# Patient Record
Sex: Female | Born: 1960 | Race: White | Hispanic: No | Marital: Single | Smoking: Never smoker
Health system: Southern US, Community
[De-identification: ages and names within clinical notes are randomized; demographics above are authoritative.]

## PROBLEM LIST (undated history)

## (undated) ENCOUNTER — Emergency Department (HOSPITAL_COMMUNITY): Payer: BC Managed Care – PPO

## (undated) DIAGNOSIS — J45909 Unspecified asthma, uncomplicated: Secondary | ICD-10-CM

## (undated) DIAGNOSIS — I10 Essential (primary) hypertension: Secondary | ICD-10-CM

## (undated) DIAGNOSIS — M1990 Unspecified osteoarthritis, unspecified site: Secondary | ICD-10-CM

## (undated) DIAGNOSIS — T7840XA Allergy, unspecified, initial encounter: Secondary | ICD-10-CM

## (undated) DIAGNOSIS — Z8619 Personal history of other infectious and parasitic diseases: Secondary | ICD-10-CM

## (undated) HISTORY — PX: TONSILLECTOMY AND ADENOIDECTOMY: SUR1326

## (undated) HISTORY — DX: Unspecified asthma, uncomplicated: J45.909

## (undated) HISTORY — DX: Essential (primary) hypertension: I10

## (undated) HISTORY — DX: Personal history of other infectious and parasitic diseases: Z86.19

## (undated) HISTORY — DX: Unspecified osteoarthritis, unspecified site: M19.90

## (undated) HISTORY — PX: DILATION AND CURETTAGE OF UTERUS: SHX78

## (undated) HISTORY — DX: Allergy, unspecified, initial encounter: T78.40XA

---
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
## 1998-11-22 HISTORY — PX: ABDOMINAL HYSTERECTOMY: SHX81

## 2013-07-19 ENCOUNTER — Telehealth: Payer: Self-pay | Admitting: Family Medicine

## 2013-07-19 NOTE — Telephone Encounter (Signed)
Pt has an appointment on October 21st for a new patient appointment but patients need a refill on two medications until then.  (lisinopril 40mg , symbicort 460-4.20mcg inhaler) thanks Pharmacy: [facility]

## 2013-07-20 ENCOUNTER — Other Ambulatory Visit: Payer: Self-pay | Admitting: *Deleted

## 2013-07-20 DIAGNOSIS — I10 Essential (primary) hypertension: Secondary | ICD-10-CM

## 2013-07-20 DIAGNOSIS — J441 Chronic obstructive pulmonary disease with (acute) exacerbation: Secondary | ICD-10-CM

## 2013-07-20 MED ORDER — LISINOPRIL 40 MG PO TABS: 40.0000 mg | ORAL_TABLET | Freq: Every day | ORAL | Status: DC

## 2013-07-20 MED ORDER — BUDESONIDE-FORMOTEROL FUMARATE 160-4.5 MCG/ACT IN AERO: 2.0000 | INHALATION_SPRAY | Freq: Two times a day (BID) | RESPIRATORY_TRACT | Status: DC

## 2013-07-20 NOTE — Telephone Encounter (Signed)
Rx for lisinopril and symbicort sent to [facility] on [facility].

## 2013-09-07 ENCOUNTER — Telehealth: Payer: Self-pay

## 2013-09-07 NOTE — Telephone Encounter (Signed)
Patient cancelled due to moving and working in [place].  Wanted to be close. [facility] and advised of resources available at that location.

## 2013-09-11 ENCOUNTER — Ambulatory Visit: Payer: BC Managed Care – PPO | Admitting: Family Medicine

## 2017-07-18 ENCOUNTER — Encounter: Payer: Self-pay | Admitting: Internal Medicine

## 2017-07-18 ENCOUNTER — Ambulatory Visit (INDEPENDENT_AMBULATORY_CARE_PROVIDER_SITE_OTHER): Payer: BC Managed Care – PPO | Admitting: Internal Medicine

## 2017-07-18 VITALS — BP 160/108 | HR 96 | Temp 98.1°F | Ht 65.75 in | Wt 209.0 lb

## 2017-07-18 DIAGNOSIS — M160 Bilateral primary osteoarthritis of hip: Secondary | ICD-10-CM | POA: Diagnosis not present

## 2017-07-18 DIAGNOSIS — J45909 Unspecified asthma, uncomplicated: Secondary | ICD-10-CM | POA: Insufficient documentation

## 2017-07-18 DIAGNOSIS — J4530 Mild persistent asthma, uncomplicated: Secondary | ICD-10-CM | POA: Diagnosis not present

## 2017-07-18 DIAGNOSIS — I10 Essential (primary) hypertension: Secondary | ICD-10-CM | POA: Diagnosis not present

## 2017-07-18 MED ORDER — PROAIR HFA 108 (90 BASE) MCG/ACT IN AERS: 1.0000 | INHALATION_SPRAY | RESPIRATORY_TRACT | 11 refills | Status: DC | PRN

## 2017-07-18 MED ORDER — FLUTICASONE PROPIONATE HFA 110 MCG/ACT IN AERO: 2.0000 | INHALATION_SPRAY | Freq: Two times a day (BID) | RESPIRATORY_TRACT | 12 refills | Status: DC

## 2017-07-18 NOTE — Assessment & Plan Note (Signed)
She reports she has white coat syndrome She will continue Lisinopril 40 mg daily Continue to monitor BP at home

## 2017-07-18 NOTE — Progress Notes (Signed)
HPI  Pt presents to the clinic today to establish care and for management of the conditions listed below.   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 She uses her Albuterol a few times per day. She has not seen pulmonology in many years.  HTN: Her BP today is 160/108. She is taking Lisinopril daily as prescribed, although she reports she did not take her blood pressure medication this morning. She reports her BP is always higher at the doctor's office. It runs in the 120/80's at home.   Arthritis: Mainly in her hips. She takes Advil as needed with good relief.   Flu: 08/2015 Tetanus: < 5 years ago Pap Smear: unsure, partial hysterectomy Mammogram: 2-3 years ago Colon Screening: never Vision Screening: annually Dentist: annually  Past Medical History:  Diagnosis Date  . Allergy   . Arthritis   . Asthma   . History of chicken pox   . Hypertension     Current Outpatient Prescriptions  Medication Sig Dispense Refill  . lisinopril (PRINIVIL,ZESTRIL) 40 MG tablet Take 1 tablet (40 mg total) by mouth daily. 60 tablet 0  . montelukast (SINGULAIR) 10 MG tablet     . PROAIR HFA 108 (90 Base) MCG/ACT inhaler      No current facility-administered medications for this visit.     No Known Allergies  Family History  Problem Relation Age of Onset  . Arthritis Mother   . Colon cancer Mother   . Stroke Mother   . Arthritis Father   . Asthma Father   . Diabetes Father   . Hypertension Father   . Asthma Daughter   . Stroke Maternal Grandmother   . Arthritis Maternal Grandfather   . Depression Maternal Grandfather   . Heart disease Paternal Grandfather   . Arthritis Paternal Grandfather     Social History   Social History  . Marital status: Single    Spouse name: N/A  . Number of children: N/A  . Years of education: N/A   Occupational History  . Not on file.   Social History Main Topics  . Smoking status: Never Smoker  . Smokeless tobacco: Never Used   . Alcohol use Yes     Comment: rare  . Drug use: No  . Sexual activity: Not on file   Other Topics Concern  . Not on file   Social History Narrative  . No narrative on file    ROS:  Constitutional: Denies fever, malaise, fatigue, headache or abrupt weight changes.  HEENT: Denies eye pain, eye redness, ear pain, ringing in the ears, wax buildup, runny nose, nasal congestion, bloody nose, or sore throat. Respiratory: Denies difficulty breathing, shortness of breath, cough or sputum production.   Cardiovascular: Denies chest pain, chest tightness, palpitations or swelling in the hands or feet.  Gastrointestinal: Denies abdominal pain, bloating, constipation, diarrhea or blood in the stool.  GU: Denies frequency, urgency, pain with urination, blood in urine, odor or discharge. Musculoskeletal: Pt reports intermittent joint pain. Denies decrease in range of motion, difficulty with gait, muscle pain or joint welling.  Skin: Denies redness, rashes, lesions or ulcercations.  Neurological: Denies dizziness, difficulty with memory, difficulty with speech or problems with balance and coordination.  Psych: Denies anxiety, depression, SI/HI.  No other specific complaints in a complete review of systems (except as listed in HPI above).  PE:  BP (!) 160/108   Pulse 96   Temp 98.1 F (36.7 C) (Oral)   Ht 5' 5.75" (1.67 m)  Wt 209 lb (94.8 kg)   SpO2 97%   BMI 33.99 kg/m   Wt Readings from Last 3 Encounters:  07/18/17 209 lb (94.8 kg)    General: Appears her stated age, obese in NAD. Cardiovascular: Normal rate and rhythm. S1,S2 noted.  No murmur, rubs or gallops noted.  Pulmonary/Chest: Normal effort and positive vesicular breath sounds. No respiratory distress. No wheezes, rales or ronchi noted.  Musculoskeletal:  No difficulty with gait.  Neurological: Alert and oriented.  Psychiatric: Mood and affect normal. Behavior is normal. Judgment and thought content normal.     Assessment and Plan:

## 2017-07-18 NOTE — Patient Instructions (Signed)

## 2017-07-18 NOTE — Assessment & Plan Note (Signed)
Increase Flovent to 110 mcg BID Continue Singulair daily and Albuterol prn

## 2017-07-18 NOTE — Assessment & Plan Note (Signed)
Encouraged weight loss Continue Advil prn

## 2017-07-20 ENCOUNTER — Encounter: Payer: Self-pay | Admitting: Internal Medicine

## 2017-09-08 ENCOUNTER — Encounter: Payer: BC Managed Care – PPO | Admitting: Internal Medicine

## 2017-10-10 ENCOUNTER — Encounter: Payer: Self-pay | Admitting: Internal Medicine

## 2017-10-10 ENCOUNTER — Ambulatory Visit (INDEPENDENT_AMBULATORY_CARE_PROVIDER_SITE_OTHER): Payer: BC Managed Care – PPO | Admitting: Internal Medicine

## 2017-10-10 VITALS — BP 152/98 | HR 100 | Temp 97.9°F | Ht 65.75 in | Wt 204.0 lb

## 2017-10-10 DIAGNOSIS — E559 Vitamin D deficiency, unspecified: Secondary | ICD-10-CM

## 2017-10-10 DIAGNOSIS — Z0000 Encounter for general adult medical examination without abnormal findings: Secondary | ICD-10-CM | POA: Diagnosis not present

## 2017-10-10 DIAGNOSIS — Z1231 Encounter for screening mammogram for malignant neoplasm of breast: Secondary | ICD-10-CM | POA: Diagnosis not present

## 2017-10-10 DIAGNOSIS — Z1239 Encounter for other screening for malignant neoplasm of breast: Secondary | ICD-10-CM

## 2017-10-10 DIAGNOSIS — Z131 Encounter for screening for diabetes mellitus: Secondary | ICD-10-CM

## 2017-10-10 DIAGNOSIS — Z13220 Encounter for screening for lipoid disorders: Secondary | ICD-10-CM | POA: Diagnosis not present

## 2017-10-10 DIAGNOSIS — I10 Essential (primary) hypertension: Secondary | ICD-10-CM | POA: Diagnosis not present

## 2017-10-10 DIAGNOSIS — Z23 Encounter for immunization: Secondary | ICD-10-CM

## 2017-10-10 DIAGNOSIS — Z1211 Encounter for screening for malignant neoplasm of colon: Secondary | ICD-10-CM | POA: Diagnosis not present

## 2017-10-10 LAB — CBC
HCT: 46.9 % — ABNORMAL HIGH (ref 36.0–46.0)
Hemoglobin: 15.5 g/dL — ABNORMAL HIGH (ref 12.0–15.0)
MCHC: 33.1 g/dL (ref 30.0–36.0)
MCV: 89.9 fl (ref 78.0–100.0)
Platelets: 245 10*3/uL (ref 150.0–400.0)
RBC: 5.22 Mil/uL — AB (ref 3.87–5.11)
RDW: 14.6 % (ref 11.5–15.5)
WBC: 7 10*3/uL (ref 4.0–10.5)

## 2017-10-10 LAB — COMPREHENSIVE METABOLIC PANEL
ALBUMIN: 4.3 g/dL (ref 3.5–5.2)
ALT: 14 U/L (ref 0–35)
AST: 14 U/L (ref 0–37)
Alkaline Phosphatase: 56 U/L (ref 39–117)
BUN: 17 mg/dL (ref 6–23)
CO2: 29 mEq/L (ref 19–32)
Calcium: 9.6 mg/dL (ref 8.4–10.5)
Chloride: 106 mEq/L (ref 96–112)
Creatinine, Ser: 0.7 mg/dL (ref 0.40–1.20)
GFR: 91.59 mL/min (ref >60.00)
Glucose, Bld: 89 mg/dL (ref 70–99)
POTASSIUM: 4 meq/L (ref 3.5–5.1)
SODIUM: 142 meq/L (ref 135–145)
Total Bilirubin: 0.5 mg/dL (ref 0.2–1.2)
Total Protein: 7.3 g/dL (ref 6.0–8.3)

## 2017-10-10 LAB — LIPID PANEL
CHOLESTEROL: 176 mg/dL (ref 0–200)
HDL: 67.7 mg/dL (ref >39.00)
LDL CALC: 99 mg/dL (ref 0–99)
NonHDL: 108.12
Total CHOL/HDL Ratio: 3
Triglycerides: 47 mg/dL (ref 0.0–149.0)
VLDL: 9.4 mg/dL (ref 0.0–40.0)

## 2017-10-10 LAB — VITAMIN D 25 HYDROXY (VIT D DEFICIENCY, FRACTURES): VITD: 28.78 ng/mL — ABNORMAL LOW (ref 30.00–100.00)

## 2017-10-10 LAB — HEMOGLOBIN A1C: HEMOGLOBIN A1C: 5.8 % (ref 4.6–6.5)

## 2017-10-10 NOTE — Patient Instructions (Signed)
Health Maintenance for Postmenopausal Women Menopause is a normal process in which your reproductive ability comes to an end. This process happens gradually over a span of months to years, usually between the ages of 22 and 9. Menopause is complete when you have missed 12 consecutive menstrual periods. It is important to talk with your health care provider about some of the most common conditions that affect postmenopausal women, such as heart disease, cancer, and bone loss (osteoporosis). Adopting a healthy lifestyle and getting preventive care can help to promote your health and wellness. Those actions can also lower your chances of developing some of these common conditions. What should I know about menopause? During menopause, you may experience a number of symptoms, such as:  Moderate-to-severe hot flashes.  Night sweats.  Decrease in sex drive.  Mood swings.  Headaches.  Tiredness.  Irritability.  Memory problems.  Insomnia.  Choosing to treat or not to treat menopausal changes is an individual decision that you make with your health care provider. What should I know about hormone replacement therapy and supplements? Hormone therapy products are effective for treating symptoms that are associated with menopause, such as hot flashes and night sweats. Hormone replacement carries certain risks, especially as you become older. If you are thinking about using estrogen or estrogen with progestin treatments, discuss the benefits and risks with your health care provider. What should I know about heart disease and stroke? Heart disease, heart attack, and stroke become more likely as you age. This may be due, in part, to the hormonal changes that your body experiences during menopause. These can affect how your body processes dietary fats, triglycerides, and cholesterol. Heart attack and stroke are both medical emergencies. There are many things that you can do to help prevent heart disease  and stroke:  Have your blood pressure checked at least every 1-2 years. High blood pressure causes heart disease and increases the risk of stroke.  If you are 53-22 years old, ask your health care provider if you should take aspirin to prevent a heart attack or a stroke.  Do not use any tobacco products, including cigarettes, chewing tobacco, or electronic cigarettes. If you need help quitting, ask your health care provider.  It is important to eat a healthy diet and maintain a healthy weight. ? Be sure to include plenty of vegetables, fruits, low-fat dairy products, and lean protein. ? Avoid eating foods that are high in solid fats, added sugars, or salt (sodium).  Get regular exercise. This is one of the most important things that you can do for your health. ? Try to exercise for at least 150 minutes each week. The type of exercise that you do should increase your heart rate and make you sweat. This is known as moderate-intensity exercise. ? Try to do strengthening exercises at least twice each week. Do these in addition to the moderate-intensity exercise.  Know your numbers.Ask your health care provider to check your cholesterol and your blood glucose. Continue to have your blood tested as directed by your health care provider.  What should I know about cancer screening? There are several types of cancer. Take the following steps to reduce your risk and to catch any cancer development as early as possible. Breast Cancer  Practice breast self-awareness. ? This means understanding how your breasts normally appear and feel. ? It also means doing regular breast self-exams. Let your health care provider know about any changes, no matter how small.  If you are 40  or older, have a clinician do a breast exam (clinical breast exam or CBE) every year. Depending on your age, family history, and medical history, it may be recommended that you also have a yearly breast X-ray (mammogram).  If you  have a family history of breast cancer, talk with your health care provider about genetic screening.  If you are at high risk for breast cancer, talk with your health care provider about having an MRI and a mammogram every year.  Breast cancer (BRCA) gene test is recommended for women who have family members with BRCA-related cancers. Results of the assessment will determine the need for genetic counseling and BRCA1 and for BRCA2 testing. BRCA-related cancers include these types: ? Breast. This occurs in males or females. ? Ovarian. ? Tubal. This may also be called fallopian tube cancer. ? Cancer of the abdominal or pelvic lining (peritoneal cancer). ? Prostate. ? Pancreatic.  Cervical, Uterine, and Ovarian Cancer Your health care provider may recommend that you be screened regularly for cancer of the pelvic organs. These include your ovaries, uterus, and vagina. This screening involves a pelvic exam, which includes checking for microscopic changes to the surface of your cervix (Pap test).  For women ages 21-65, health care providers may recommend a pelvic exam and a Pap test every three years. For women ages 79-65, they may recommend the Pap test and pelvic exam, combined with testing for human papilloma virus (HPV), every five years. Some types of HPV increase your risk of cervical cancer. Testing for HPV may also be done on women of any age who have unclear Pap test results.  Other health care providers may not recommend any screening for nonpregnant women who are considered low risk for pelvic cancer and have no symptoms. Ask your health care provider if a screening pelvic exam is right for you.  If you have had past treatment for cervical cancer or a condition that could lead to cancer, you need Pap tests and screening for cancer for at least 20 years after your treatment. If Pap tests have been discontinued for you, your risk factors (such as having a new sexual partner) need to be  reassessed to determine if you should start having screenings again. Some women have medical problems that increase the chance of getting cervical cancer. In these cases, your health care provider may recommend that you have screening and Pap tests more often.  If you have a family history of uterine cancer or ovarian cancer, talk with your health care provider about genetic screening.  If you have vaginal bleeding after reaching menopause, tell your health care provider.  There are currently no reliable tests available to screen for ovarian cancer.  Lung Cancer Lung cancer screening is recommended for adults 69-62 years old who are at high risk for lung cancer because of a history of smoking. A yearly low-dose CT scan of the lungs is recommended if you:  Currently smoke.  Have a history of at least 30 pack-years of smoking and you currently smoke or have quit within the past 15 years. A pack-year is smoking an average of one pack of cigarettes per day for one year.  Yearly screening should:  Continue until it has been 15 years since you quit.  Stop if you develop a health problem that would prevent you from having lung cancer treatment.  Colorectal Cancer  This type of cancer can be detected and can often be prevented.  Routine colorectal cancer screening usually begins at  age 42 and continues through age 45.  If you have risk factors for colon cancer, your health care provider may recommend that you be screened at an earlier age.  If you have a family history of colorectal cancer, talk with your health care provider about genetic screening.  Your health care provider may also recommend using home test kits to check for hidden blood in your stool.  A small camera at the end of a tube can be used to examine your colon directly (sigmoidoscopy or colonoscopy). This is done to check for the earliest forms of colorectal cancer.  Direct examination of the colon should be repeated every  5-10 years until age 71. However, if early forms of precancerous polyps or small growths are found or if you have a family history or genetic risk for colorectal cancer, you may need to be screened more often.  Skin Cancer  Check your skin from head to toe regularly.  Monitor any moles. Be sure to tell your health care provider: ? About any new moles or changes in moles, especially if there is a change in a mole's shape or color. ? If you have a mole that is larger than the size of a pencil eraser.  If any of your family members has a history of skin cancer, especially at a young age, talk with your health care provider about genetic screening.  Always use sunscreen. Apply sunscreen liberally and repeatedly throughout the day.  Whenever you are outside, protect yourself by wearing long sleeves, pants, a wide-brimmed hat, and sunglasses.  What should I know about osteoporosis? Osteoporosis is a condition in which bone destruction happens more quickly than new bone creation. After menopause, you may be at an increased risk for osteoporosis. To help prevent osteoporosis or the bone fractures that can happen because of osteoporosis, the following is recommended:  If you are 46-71 years old, get at least 1,000 mg of calcium and at least 600 mg of vitamin D per day.  If you are older than age 55 but younger than age 65, get at least 1,200 mg of calcium and at least 600 mg of vitamin D per day.  If you are older than age 54, get at least 1,200 mg of calcium and at least 800 mg of vitamin D per day.  Smoking and excessive alcohol intake increase the risk of osteoporosis. Eat foods that are rich in calcium and vitamin D, and do weight-bearing exercises several times each week as directed by your health care provider. What should I know about how menopause affects my mental health? Depression may occur at any age, but it is more common as you become older. Common symptoms of depression  include:  Low or sad mood.  Changes in sleep patterns.  Changes in appetite or eating patterns.  Feeling an overall lack of motivation or enjoyment of activities that you previously enjoyed.  Frequent crying spells.  Talk with your health care provider if you think that you are experiencing depression. What should I know about immunizations? It is important that you get and maintain your immunizations. These include:  Tetanus, diphtheria, and pertussis (Tdap) booster vaccine.  Influenza every year before the flu season begins.  Pneumonia vaccine.  Shingles vaccine.  Your health care provider may also recommend other immunizations. This information is not intended to replace advice given to you by your health care provider. Make sure you discuss any questions you have with your health care provider. Document Released: 12/31/2005  Document Revised: 05/28/2016 Document Reviewed: 08/12/2015 Elsevier Interactive Patient Education  2018 Elsevier Inc.  

## 2017-10-10 NOTE — Progress Notes (Signed)
Subjective:    Patient ID: Caitlyn Robertson, female    DOB: 1961/06/08, 56 y.o.   MRN: [id number]  HPI  Pt presents to the clinic today for her annual exam.  Flu: 08/2015 Tetanus: < 5 years Pap Smear: partial hysterectomy Mammogram: > 2 years ago Colon Screening: never Vision Screening: annually Dentist: annually  Diet: She is consuming a high protein, low carb and low sugar diet. She is trying to avoid fried foods. She drinks mostly water. Exercise: Walking 2-3 days per week  Review of Systems  Past Medical History:  Diagnosis Date  . Allergy   . Arthritis   . Asthma   . History of chicken pox   . Hypertension     Current Outpatient Medications  Medication Sig Dispense Refill  . fluticasone (FLOVENT HFA) 110 MCG/ACT inhaler Inhale 2 puffs into the lungs 2 (two) times daily. 1 Inhaler 12  . lisinopril (PRINIVIL,ZESTRIL) 40 MG tablet Take 1 tablet (40 mg total) by mouth daily. 60 tablet 0  . montelukast (SINGULAIR) 10 MG tablet     . PROAIR HFA 108 (90 Base) MCG/ACT inhaler Inhale 1-2 puffs into the lungs every 4 (four) hours as needed for wheezing or shortness of breath. 1 Inhaler 11   No current facility-administered medications for this visit.     No Known Allergies  Family History  Problem Relation Age of Onset  . Arthritis Mother   . Colon cancer Mother   . Stroke Mother   . Arthritis Father   . Asthma Father   . Diabetes Father   . Hypertension Father   . Asthma Daughter   . Stroke Maternal Grandmother   . Arthritis Maternal Grandfather   . Depression Maternal Grandfather   . Heart disease Paternal Grandfather   . Arthritis Paternal Grandfather     Social History   Socioeconomic History  . Marital status: Single    Spouse name: Not on file  . Number of children: Not on file  . Years of education: Not on file  . Highest education level: Not on file  Social Needs  . Financial resource strain: Not on file  . Food insecurity - worry: Not on  file  . Food insecurity - inability: Not on file  . Transportation needs - medical: Not on file  . Transportation needs - non-medical: Not on file  Occupational History  . Not on file  Tobacco Use  . Smoking status: Never Smoker  . Smokeless tobacco: Never Used  Substance and Sexual Activity  . Alcohol use: Yes    Comment: rare  . Drug use: No  . Sexual activity: Not on file  Other Topics Concern  . Not on file  Social History Narrative  . Not on file     Constitutional: Denies fever, malaise, fatigue, headache or abrupt weight changes.  HEENT: Denies eye pain, eye redness, ear pain, ringing in the ears, wax buildup, runny nose, nasal congestion, bloody nose, or sore throat. Respiratory: Denies difficulty breathing, shortness of breath, cough or sputum production.   Cardiovascular: Denies chest pain, chest tightness, palpitations or swelling in the hands or feet.  Gastrointestinal: Denies abdominal pain, bloating, constipation, diarrhea or blood in the stool.  GU: Denies urgency, frequency, pain with urination, burning sensation, blood in urine, odor or discharge. Musculoskeletal: Pt reports intermittent hip pain. Denies decrease in range of motion, difficulty with gait, muscle pain or joint swelling.  Skin: Denies redness, rashes, lesions or ulcercations.  Neurological: Denies dizziness,  difficulty with memory, difficulty with speech or problems with balance and coordination.  Psych: Denies anxiety, depression, SI/HI.  No other specific complaints in a complete review of systems (except as listed in HPI above).  Objective:   Physical Exam   BP (!) 152/98   Pulse 100   Temp 97.9 F (36.6 C) (Oral)   Ht 5' 5.75" (1.67 m)   Wt 204 lb (92.5 kg)   SpO2 97%   BMI 33.18 kg/m  Wt Readings from Last 3 Encounters:  10/10/17 204 lb (92.5 kg)  07/18/17 209 lb (94.8 kg)    General: Appears her stated age,obese in NAD. Skin: Warm, dry and intact.  HEENT: Head: normal shape  and size; Eyes: sclera white, no icterus, conjunctiva pink, PERRLA and EOMs intact; Ears: Tm's gray and intact, normal light reflex; Throat/Mouth: Teeth present, mucosa pink and moist, no exudate, lesions or ulcerations noted.  Neck:  Neck supple, trachea midline. No masses, lumps or thyromegaly present.  Cardiovascular: Normal rate and rhythm. S1,S2 noted.  No murmur, rubs or gallops noted. No JVD or BLE edema. No carotid bruits noted. Pulmonary/Chest: Normal effort and positive vesicular breath sounds. No respiratory distress. No wheezes, rales or ronchi noted.  Abdomen: Soft and nontender. Normal bowel sounds. No distention or masses noted. Liver, spleen and kidneys non palpable. Musculoskeletal: Strength 5/5 BUE/BLE. No difficulty with gait.  Neurological: Alert and oriented. Cranial nerves II-XII grossly intact. Coordination normal.  Psychiatric: Mood and affect normal. Behavior is normal. Judgment and thought content normal.         Assessment & Plan:   Preventative Health Maintenance:  Flu shot today Tetanus UTD per her report She declines pelvic exam and does not need pap smears Mammogram ordered, she will call [facility] to schedule, number provided Referral placed to GI for screening colonoscopy- they will call you to schedule Encouraged her to consume a balanced diet and exercise regimen Advised her to see an eye doctor and dentist annually Will check CBC< CMET, Lipid, A1C and Vit D today  RTC in 1 year, sooner if needed Webb Silversmith, NP

## 2017-10-10 NOTE — Assessment & Plan Note (Signed)
Elevated today but she reports this is much lower at home Reinforced DASH diet and exercise for weight loss CBC and CMET today Continue Lisinopril for now

## 2017-10-20 ENCOUNTER — Encounter: Payer: Self-pay | Admitting: Internal Medicine

## 2017-11-25 ENCOUNTER — Telehealth: Payer: Self-pay | Admitting: Gastroenterology

## 2017-11-25 ENCOUNTER — Other Ambulatory Visit: Payer: Self-pay

## 2017-11-25 DIAGNOSIS — Z8371 Family history of colonic polyps: Secondary | ICD-10-CM

## 2017-11-25 NOTE — Telephone Encounter (Signed)
Gastroenterology Pre-Procedure Review  Request Date:   Requesting Physician: Dr.    PATIENT REVIEW QUESTIONS: The patient responded to the following health history questions as indicated:    1. Are you having any GI issues? no 2. Do you have a personal history of Polyps? no 3. Do you have a family history of Colon Cancer or Polyps? yes (Mother -Colon cancer) 4. Diabetes Mellitus? no 5. Joint replacements in the past 12 months?no 6. Major health problems in the past 3 months?no 7. Any artificial heart valves, MVP, or defibrillator?no    MEDICATIONS & ALLERGIES:    Patient reports the following regarding taking any anticoagulation/antiplatelet therapy:   Plavix, Coumadin, Eliquis, Xarelto, Lovenox, Pradaxa, Brilinta, or Effient? no Aspirin? no  Patient confirms/reports the following medications:  Current Outpatient Medications  Medication Sig Dispense Refill   fluticasone (FLOVENT HFA) 110 MCG/ACT inhaler Inhale 2 puffs into the lungs 2 (two) times daily. 1 Inhaler 12   lisinopril (PRINIVIL,ZESTRIL) 40 MG tablet Take 1 tablet (40 mg total) by mouth daily. 60 tablet 0   montelukast (SINGULAIR) 10 MG tablet      PROAIR HFA 108 (90 Base) MCG/ACT inhaler Inhale 1-2 puffs into the lungs every 4 (four) hours as needed for wheezing or shortness of breath. 1 Inhaler 11   No current facility-administered medications for this visit.     Patient confirms/reports the following allergies:  No Known Allergies  No orders of the defined types were placed in this encounter.   AUTHORIZATION INFORMATION Primary Insurance: 1D#: Group #:  Secondary Insurance: 1D#: Group #:  SCHEDULE INFORMATION: Date:  Time: Location:

## 2018-01-03 ENCOUNTER — Telehealth: Payer: Self-pay | Admitting: Gastroenterology

## 2018-01-03 NOTE — Telephone Encounter (Signed)
Patient LVM that she has to r/s procedure with Dr. Vicente Males on 01/09/18 due to a pinched nerve.

## 2018-01-05 ENCOUNTER — Telehealth: Payer: Self-pay

## 2018-01-05 NOTE — Telephone Encounter (Signed)
Patient cancelled procedure due to a pinched nerve. Currently on crutches and unable to ambulate.  Patient to callback when healed.

## 2018-01-09 ENCOUNTER — Encounter: Admission: RE | Payer: Self-pay | Source: Ambulatory Visit

## 2018-01-09 ENCOUNTER — Ambulatory Visit
Admission: RE | Admit: 2018-01-09 | Payer: BC Managed Care – PPO | Source: Ambulatory Visit | Admitting: Gastroenterology

## 2018-01-09 SURGERY — COLONOSCOPY WITH PROPOFOL
Anesthesia: General

## 2018-01-13 ENCOUNTER — Encounter: Payer: Self-pay | Admitting: Internal Medicine

## 2018-02-10 ENCOUNTER — Encounter: Payer: Self-pay | Admitting: Internal Medicine

## 2018-02-10 NOTE — Telephone Encounter (Signed)
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

## 2018-02-21 ENCOUNTER — Ambulatory Visit (INDEPENDENT_AMBULATORY_CARE_PROVIDER_SITE_OTHER)
Admission: RE | Admit: 2018-02-21 | Discharge: 2018-02-21 | Disposition: A | Discharge status: Home / Self Care | Payer: BC Managed Care – PPO | Source: Ambulatory Visit | Attending: Internal Medicine | Admitting: Internal Medicine

## 2018-02-21 ENCOUNTER — Encounter: Payer: Self-pay | Admitting: Internal Medicine

## 2018-02-21 ENCOUNTER — Ambulatory Visit: Payer: BC Managed Care – PPO | Admitting: Internal Medicine

## 2018-02-21 ENCOUNTER — Other Ambulatory Visit: Payer: Self-pay | Admitting: Internal Medicine

## 2018-02-21 VITALS — BP 158/100 | HR 92 | Temp 97.9°F | Ht 65.75 in | Wt 213.0 lb

## 2018-02-21 DIAGNOSIS — M25551 Pain in right hip: Secondary | ICD-10-CM

## 2018-02-21 DIAGNOSIS — I10 Essential (primary) hypertension: Secondary | ICD-10-CM | POA: Diagnosis not present

## 2018-02-21 DIAGNOSIS — M25552 Pain in left hip: Secondary | ICD-10-CM

## 2018-02-21 DIAGNOSIS — M160 Bilateral primary osteoarthritis of hip: Secondary | ICD-10-CM

## 2018-02-21 LAB — SEDIMENTATION RATE: Sed Rate: 9 mm/hr (ref 0–30)

## 2018-02-21 MED ORDER — PREDNISONE 10 MG PO TABS: ORAL_TABLET | ORAL | 0 refills | Status: DC

## 2018-02-21 MED ORDER — MONTELUKAST SODIUM 10 MG PO TABS: 10.0000 mg | ORAL_TABLET | Freq: Every day | ORAL | 4 refills | Status: DC

## 2018-02-21 MED ORDER — MELOXICAM 15 MG PO TABS: 15.0000 mg | ORAL_TABLET | Freq: Every day | ORAL | 2 refills | Status: DC

## 2018-02-21 NOTE — Patient Instructions (Signed)

## 2018-02-21 NOTE — Progress Notes (Signed)
Subjective:    Patient ID: Caitlyn Robertson, female    DOB: [birth year], 57 y.o.   MRN: [id number]  HPI  Pt presents to the clinic today with c/o bilateral hip pain. This has been going for months, but worse in the last 2 months after trying to move a mattress by herself. She describes the pain as shore, achy, and tight with decreased range of motion. She reports the hip pain is affecting her balance. It is worse with weight bearing. She denies numbness or tingling in her legs. She denies any specific injury to the area. She has been taking Ibuprofen with some relief and been using a crutch to help take some of the pressure off of her hip. She denies additional major joint pains. She denies family history of autoimmune disorders but reports arthritis does run in her family.   Of note, her BP today is 158/100. She does have a history of HTN. She is taking Lisinopril as described. She reports her BP runs < 140/90 at home. She is not sure why it is so elevated at the office. She denies headaches, blurred vision, dizziness, chest pain or shortness of breath.  Review of Systems      Past Medical History:  Diagnosis Date  . Allergy   . Arthritis   . Asthma   . History of chicken pox   . Hypertension     Current Outpatient Medications  Medication Sig Dispense Refill  . fluticasone (FLOVENT HFA) 110 MCG/ACT inhaler Inhale 2 puffs into the lungs 2 (two) times daily. 1 Inhaler 12  . lisinopril (PRINIVIL,ZESTRIL) 40 MG tablet Take 1 tablet (40 mg total) by mouth daily. 60 tablet 0  . PROAIR HFA 108 (90 Base) MCG/ACT inhaler Inhale 1-2 puffs into the lungs every 4 (four) hours as needed for wheezing or shortness of breath. 1 Inhaler 11  . meloxicam (MOBIC) 15 MG tablet Take 1 tablet (15 mg total) by mouth daily. 30 tablet 2  . montelukast (SINGULAIR) 10 MG tablet      No current facility-administered medications for this visit.     No Known Allergies  Family History  Problem Relation Age  of Onset  . Arthritis Mother   . Colon cancer Mother   . Stroke Mother   . Arthritis Father   . Asthma Father   . Diabetes Father   . Hypertension Father   . Asthma Daughter   . Stroke Maternal Grandmother   . Arthritis Maternal Grandfather   . Depression Maternal Grandfather   . Heart disease Paternal Grandfather   . Arthritis Paternal Grandfather     Social History   Socioeconomic History  . Marital status: Single    Spouse name: Not on file  . Number of children: Not on file  . Years of education: Not on file  . Highest education level: Not on file  Occupational History  . Not on file  Social Needs  . Financial resource strain: Not on file  . Food insecurity:    Worry: Not on file    Inability: Not on file  . Transportation needs:    Medical: Not on file    Non-medical: Not on file  Tobacco Use  . Smoking status: Never Smoker  . Smokeless tobacco: Never Used  Substance and Sexual Activity  . Alcohol use: Yes    Comment: rare  . Drug use: No  . Sexual activity: Not on file  Lifestyle  . Physical activity:  Days per week: Not on file    Minutes per session: Not on file  . Stress: Not on file  Relationships  . Social connections:    Talks on phone: Not on file    Gets together: Not on file    Attends religious service: Not on file    Active member of club or organization: Not on file    Attends meetings of clubs or organizations: Not on file    Relationship status: Not on file  . Intimate partner violence:    Fear of current or ex partner: Not on file    Emotionally abused: Not on file    Physically abused: Not on file    Forced sexual activity: Not on file  Other Topics Concern  . Not on file  Social History Narrative  . Not on file     Constitutional: Denies fever, malaise, fatigue, headache or abrupt weight changes.  Musculoskeletal: Pt reports bilateral hip pain, decreased range of motion. Denies joint swelling.  Neurological: Pt reports  difficulty with balance. Denies dizziness, difficulty with memory, difficulty with speech or problems with coordination.    No other specific complaints in a complete review of systems (except as listed in HPI above).  Objective:   Physical Exam   BP (!) 158/100   Pulse 92   Temp 97.9 F (36.6 C) (Oral)   Ht 5' 5.75" (1.67 m)   Wt 213 lb (96.6 kg)   SpO2 98%   BMI 34.64 kg/m  Wt Readings from Last 3 Encounters:  02/21/18 213 lb (96.6 kg)  10/10/17 204 lb (92.5 kg)  07/18/17 209 lb (94.8 kg)    General: Appears her stated age, obese in NAD. Musculoskeletal: Normal flexion, extension and adduction of bilateral hips. Limited abduction of bilateral hips. No pain with palpation over lumbar spine or bilateral hips. Decreased internal and external rotation of bilateral hips. Strength 5/5 BLE. She limps with normal gait. She has trouble walking on toes and heels. Neurological: Alert and oriented. Sensation intact to BLE.   BMET    Component Value Date/Time   NA 142 10/10/2017 0847   K 4.0 10/10/2017 0847   CL 106 10/10/2017 0847   CO2 29 10/10/2017 0847   GLUCOSE 89 10/10/2017 0847   BUN 17 10/10/2017 0847   CREATININE 0.70 10/10/2017 0847   CALCIUM 9.6 10/10/2017 0847    Lipid Panel     Component Value Date/Time   CHOL 176 10/10/2017 0847   TRIG 47.0 10/10/2017 0847   HDL 67.70 10/10/2017 0847   CHOLHDL 3 10/10/2017 0847   VLDL 9.4 10/10/2017 0847   LDLCALC 99 10/10/2017 0847    CBC    Component Value Date/Time   WBC 7.0 10/10/2017 0847   RBC 5.22 (H) 10/10/2017 0847   HGB 15.5 (H) 10/10/2017 0847   HCT 46.9 (H) 10/10/2017 0847   PLT 245.0 10/10/2017 0847   MCV 89.9 10/10/2017 0847   MCHC 33.1 10/10/2017 0847   RDW 14.6 10/10/2017 0847    Hgb A1C Lab Results  Component Value Date   HGBA1C 5.8 10/10/2017           Assessment & Plan:   Bilateral Hip Pain:  Xray bilateral hips today Will check RF, ESR and ANA given her severe limited ROM eRx  for Meloxicam 15 mg daily- avoid other NSAID's Can take Tylenol as needed for pain Discussed how weight loss could improve some of her joint pain   Will follow up after  labs/xrays. Return precautions discussed Webb Silversmith, NP  May need referral to ortho, but will wait for labs/xrays results first

## 2018-02-21 NOTE — Assessment & Plan Note (Signed)
Remains elevated despite Lisinopril She thinks this is white coat syndrome Encouraged her to continue to monitor BP at home, notify me if persistently > 140/90 Reinforced DASH diet and exercise for weight loss

## 2018-02-23 LAB — ANA: ANA: NEGATIVE

## 2018-02-27 ENCOUNTER — Encounter: Payer: Self-pay | Admitting: Internal Medicine

## 2018-02-28 ENCOUNTER — Encounter: Payer: Self-pay | Admitting: Internal Medicine

## 2018-03-06 ENCOUNTER — Encounter: Payer: Self-pay | Admitting: Internal Medicine

## 2018-03-06 NOTE — Telephone Encounter (Signed)
Form place in a blue folder labeled "Forms for patient (HOLD)"

## 2018-03-14 ENCOUNTER — Ambulatory Visit: Payer: BC Managed Care – PPO | Admitting: Internal Medicine

## 2018-03-14 ENCOUNTER — Ambulatory Visit: Payer: BC Managed Care – PPO | Admitting: Podiatry

## 2018-03-14 ENCOUNTER — Encounter: Payer: Self-pay | Admitting: Podiatry

## 2018-03-14 ENCOUNTER — Encounter: Payer: Self-pay | Admitting: Internal Medicine

## 2018-03-14 VITALS — BP 178/110 | HR 99 | Temp 97.6°F | Ht 65.75 in | Wt 214.0 lb

## 2018-03-14 DIAGNOSIS — Z01818 Encounter for other preprocedural examination: Secondary | ICD-10-CM

## 2018-03-14 DIAGNOSIS — B070 Plantar wart: Secondary | ICD-10-CM

## 2018-03-14 DIAGNOSIS — M25551 Pain in right hip: Secondary | ICD-10-CM

## 2018-03-14 LAB — POCT URINALYSIS DIPSTICK
BILIRUBIN UA: NEGATIVE
Blood, UA: NEGATIVE
Glucose, UA: NEGATIVE
KETONES UA: NEGATIVE
Leukocytes, UA: NEGATIVE
Nitrite, UA: NEGATIVE
Protein, UA: NEGATIVE
Spec Grav, UA: >=1.03 — AB (ref 1.010–1.025)
Urobilinogen, UA: 0.2 E.U./dL
pH, UA: 6 (ref 5.0–8.0)

## 2018-03-14 MED ORDER — PROAIR HFA 108 (90 BASE) MCG/ACT IN AERS: 1.0000 | INHALATION_SPRAY | RESPIRATORY_TRACT | 11 refills | Status: DC | PRN

## 2018-03-14 NOTE — Progress Notes (Signed)
Subjective:    Patient ID: Caitlyn Robertson, female    DOB: 12/03/1960, 57 y.o.   MRN: [id number]  HPI  Patient presents to the clinic today for preop screening.  She is planning on having a right hip replacement in June.  She needs an EKG and lab work today.    Of note, her blood pressure is 170/110.  She is anxious when she comes to the doctor. She is taking her lisinopril daily as prescribed.  She denies chest pain or shortness of breath.   Review of Systems      Past Medical History:  Diagnosis Date  . Allergy   . Arthritis   . Asthma   . History of chicken pox   . Hypertension     Current Outpatient Medications  Medication Sig Dispense Refill  . fluticasone (FLOVENT HFA) 110 MCG/ACT inhaler Inhale 2 puffs into the lungs 2 (two) times daily. 1 Inhaler 12  . lisinopril (PRINIVIL,ZESTRIL) 40 MG tablet Take 1 tablet (40 mg total) by mouth daily. 60 tablet 0  . meloxicam (MOBIC) 15 MG tablet Take 1 tablet (15 mg total) by mouth daily. 30 tablet 2  . montelukast (SINGULAIR) 10 MG tablet Take 1 tablet (10 mg total) by mouth daily. 30 tablet 4  . PROAIR HFA 108 (90 Base) MCG/ACT inhaler Inhale 1-2 puffs into the lungs every 4 (four) hours as needed for wheezing or shortness of breath. 1 Inhaler 11   No current facility-administered medications for this visit.     No Known Allergies  Family History  Problem Relation Age of Onset  . Arthritis Mother   . Colon cancer Mother   . Stroke Mother   . Arthritis Father   . Asthma Father   . Diabetes Father   . Hypertension Father   . Asthma Daughter   . Stroke Maternal Grandmother   . Arthritis Maternal Grandfather   . Depression Maternal Grandfather   . Heart disease Paternal Grandfather   . Arthritis Paternal Grandfather     Social History   Socioeconomic History  . Marital status: Single    Spouse name: Not on file  . Number of children: Not on file  . Years of education: Not on file  . Highest education level:  Not on file  Occupational History  . Not on file  Social Needs  . Financial resource strain: Not on file  . Food insecurity:    Worry: Not on file    Inability: Not on file  . Transportation needs:    Medical: Not on file    Non-medical: Not on file  Tobacco Use  . Smoking status: Never Smoker  . Smokeless tobacco: Never Used  Substance and Sexual Activity  . Alcohol use: Yes    Comment: rare  . Drug use: No  . Sexual activity: Not on file  Lifestyle  . Physical activity:    Days per week: Not on file    Minutes per session: Not on file  . Stress: Not on file  Relationships  . Social connections:    Talks on phone: Not on file    Gets together: Not on file    Attends religious service: Not on file    Active member of club or organization: Not on file    Attends meetings of clubs or organizations: Not on file    Relationship status: Not on file  . Intimate partner violence:    Fear of current or ex partner: Not  on file    Emotionally abused: Not on file    Physically abused: Not on file    Forced sexual activity: Not on file  Other Topics Concern  . Not on file  Social History Narrative  . Not on file    Constitutional: Denies fever, malaise, fatigue, headache or abrupt weight changes.  Respiratory: Denies difficulty breathing, shortness of breath, cough or sputum production.   Cardiovascular: Denies chest pain, chest tightness, palpitations or swelling in the hands or feet.  Musculoskeletal: Patient reports right hip pain.  Denies decrease in range of motion, muscle pain or joint swelling.    No other specific complaints in a complete review of systems (except as listed in HPI above).  Objective:   Physical Exam   BP (!) 178/110 (BP Location: Right Arm, Patient Position: Sitting, Cuff Size: Large)   Pulse 99   Temp 97.6 F (36.4 C) (Oral)   Ht 5' 5.75" (1.67 m)   Wt 214 lb (97.1 kg)   SpO2 97%   BMI 34.80 kg/m   Wt Readings from Last 3 Encounters:    03/14/18 214 lb (97.1 kg)  02/21/18 213 lb (96.6 kg)  10/10/17 204 lb (92.5 kg)    General: Appears her stated age, well developed, well nourished in NAD. Cardiovascular: Normal rate and rhythm. S1,S2 noted.  No murmur, rubs or gallops noted.  Pulmonary/Chest: Normal effort and positive vesicular breath sounds. No respiratory distress. No wheezes, rales or ronchi noted.  Musculoskeletal: She is using a crutch for assistance with gait.  Neurological: Alert and oriented.    BMET    Component Value Date/Time   NA 142 10/10/2017 0847   K 4.0 10/10/2017 0847   CL 106 10/10/2017 0847   CO2 29 10/10/2017 0847   GLUCOSE 89 10/10/2017 0847   BUN 17 10/10/2017 0847   CREATININE 0.70 10/10/2017 0847   CALCIUM 9.6 10/10/2017 0847    Lipid Panel     Component Value Date/Time   CHOL 176 10/10/2017 0847   TRIG 47.0 10/10/2017 0847   HDL 67.70 10/10/2017 0847   CHOLHDL 3 10/10/2017 0847   VLDL 9.4 10/10/2017 0847   LDLCALC 99 10/10/2017 0847    CBC    Component Value Date/Time   WBC 7.0 10/10/2017 0847   RBC 5.22 (H) 10/10/2017 0847   HGB 15.5 (H) 10/10/2017 0847   HCT 46.9 (H) 10/10/2017 0847   PLT 245.0 10/10/2017 0847   MCV 89.9 10/10/2017 0847   MCHC 33.1 10/10/2017 0847   RDW 14.6 10/10/2017 0847    Hgb A1C Lab Results  Component Value Date   HGBA1C 5.8 10/10/2017           Assessment & Plan:  Preop Cardiovascular Screening:  EKG today normal Urinalysis normal Will check CBC, CMet, A1C and Albumin today Repeat blood pressure 150/90 Form will be filled out once labs are back  Will follow up after labs, return precautions discussed Webb Silversmith, NP

## 2018-03-15 LAB — COMPREHENSIVE METABOLIC PANEL
ALK PHOS: 52 U/L (ref 39–117)
ALT: 23 U/L (ref 0–35)
AST: 19 U/L (ref 0–37)
Albumin: 4.1 g/dL (ref 3.5–5.2)
BILIRUBIN TOTAL: 0.5 mg/dL (ref 0.2–1.2)
BUN: 16 mg/dL (ref 6–23)
CALCIUM: 9.5 mg/dL (ref 8.4–10.5)
CO2: 26 mEq/L (ref 19–32)
Chloride: 107 mEq/L (ref 96–112)
Creatinine, Ser: 0.82 mg/dL (ref 0.40–1.20)
GFR: 76.19 mL/min (ref >60.00)
GLUCOSE: 97 mg/dL (ref 70–99)
Potassium: 3.6 mEq/L (ref 3.5–5.1)
Sodium: 142 mEq/L (ref 135–145)
TOTAL PROTEIN: 7 g/dL (ref 6.0–8.3)

## 2018-03-15 LAB — CBC
HCT: 42.9 % (ref 36.0–46.0)
Hemoglobin: 14.6 g/dL (ref 12.0–15.0)
MCHC: 34 g/dL (ref 30.0–36.0)
MCV: 87.1 fl (ref 78.0–100.0)
PLATELETS: 233 10*3/uL (ref 150.0–400.0)
RBC: 4.93 Mil/uL (ref 3.87–5.11)
RDW: 14.9 % (ref 11.5–15.5)
WBC: 9.1 10*3/uL (ref 4.0–10.5)

## 2018-03-15 LAB — HEMOGLOBIN A1C: Hgb A1c MFr Bld: 5.8 % (ref 4.6–6.5)

## 2018-03-15 LAB — ALBUMIN: ALBUMIN: 4.1 g/dL (ref 3.5–5.2)

## 2018-03-16 NOTE — Progress Notes (Signed)
   Subjective: 57 year old female presenting today as a new patient with a chief complaint of a lesion to the medial aspect of the right third toe that appeared about 6 months ago. She also states there appears to be a nodule located on the left foot in the 2nd interspace that has been present for the past several years. She denies any pain or modifying factors. She has not had any treatment. Patient is here for further evaluation and treatment.   Past Medical History:  Diagnosis Date  . Allergy   . Arthritis   . Asthma   . History of chicken pox   . Hypertension     Objective: Physical Exam General: The patient is alert and oriented x3 in no acute distress.  Dermatology: Hyperkeratotic skin lesion noted to the third toe of the right foot approximately 1 cm in diameter. Pinpoint bleeding noted upon debridement. Skin is warm, dry and supple bilateral lower extremities. Negative for open lesions or macerations.  Vascular: Palpable pedal pulses bilaterally. No edema or erythema noted. Capillary refill within normal limits.  Neurological: Epicritic and protective threshold grossly intact bilaterally.   Musculoskeletal Exam: Pain on palpation to the note skin lesion.  Range of motion within normal limits to all pedal and ankle joints bilateral. Muscle strength 5/5 in all groups bilateral.   Assessment: #1 wart right 3rd toe  Plan of Care:  #1 Patient was evaluated. #2 Excisional debridement of the wart lesion was performed using a chisel blade. Cantharone was applied and the lesion was dressed with a dry sterile dressing. #3 patient is to return to clinic in 2 weeks.  Scheduled for hip surgery on June 23rd.   Edrick Kins, DPM [facility]  Dr. Edrick Kins, [place]                                        [place], [place] [address]                Office [phone]  Fax [phone]

## 2018-03-19 ENCOUNTER — Encounter: Payer: Self-pay | Admitting: Internal Medicine

## 2018-03-19 NOTE — Patient Instructions (Signed)
rHip Pain The hip is the joint between the upper legs and the lower pelvis. The bones, cartilage, tendons, and muscles of your hip joint support your body and allow you to move around. Hip pain can range from a minor ache to severe pain in one or both of your hips. The pain may be felt on the inside of the hip joint near the groin, or the outside near the buttocks and upper thigh. You may also have swelling or stiffness. Follow these instructions at home: Managing pain, stiffness, and swelling  If directed, apply ice to the injured area. ? Put ice in a plastic bag. ? Place a towel between your skin and the bag. ? Leave the ice on for 20 minutes, 2-3 times a day  Sleep with a pillow between your legs on your most comfortable side.  Avoid any activities that cause pain. General instructions  Take over-the-counter and prescription medicines only as told by your health care provider.  Do any exercises as told by your health care provider.  Record the following: ? How often you have hip pain. ? The location of your pain. ? What the pain feels like. ? What makes the pain worse.  Keep all follow-up visits as told by your health care provider. This is important. Contact a health care provider if:  You cannot put weight on your leg.  Your pain or swelling continues or gets worse after one week.  It gets harder to walk.  You have a fever. Get help right away if:  You fall.  You have a sudden increase in pain and swelling in your hip.  Your hip is red or swollen or very tender to touch. Summary  Hip pain can range from a minor ache to severe pain in one or both of your hips.  The pain may be felt on the inside of the hip joint near the groin, or the outside near the buttocks and upper thigh.  Avoid any activities that cause pain.  Record how often you have hip pain, the location of the pain, what makes it worse and what it feels like. This information is not intended to  replace advice given to you by your health care provider. Make sure you discuss any questions you have with your health care provider. Document Released: 04/28/2010 Document Revised: 10/11/2016 Document Reviewed: 10/11/2016 Elsevier Interactive Patient Education  [employer].

## 2018-03-28 ENCOUNTER — Ambulatory Visit: Payer: BC Managed Care – PPO | Admitting: Podiatry

## 2018-03-28 ENCOUNTER — Encounter: Payer: Self-pay | Admitting: Internal Medicine

## 2018-03-28 ENCOUNTER — Encounter: Payer: Self-pay | Admitting: Podiatry

## 2018-03-28 DIAGNOSIS — B070 Plantar wart: Secondary | ICD-10-CM | POA: Diagnosis not present

## 2018-03-29 ENCOUNTER — Other Ambulatory Visit: Payer: Self-pay | Admitting: Internal Medicine

## 2018-03-29 ENCOUNTER — Encounter: Payer: Self-pay | Admitting: Podiatry

## 2018-03-29 ENCOUNTER — Telehealth: Payer: Self-pay

## 2018-03-29 NOTE — Telephone Encounter (Signed)
I returned call to patient and informed her that I called in script early this morning.  She stated that the pharmacy had called her and told her the script was ready.   Patient has picked up the script from the pharmacy and will call with any questions or concerns

## 2018-03-30 ENCOUNTER — Telehealth: Payer: Self-pay

## 2018-03-30 MED ORDER — NONFORMULARY OR COMPOUNDED ITEM: 1 refills | Status: DC

## 2018-03-30 NOTE — Progress Notes (Signed)
   Subjective: 57 year old female presenting today for follow up evaluation of a wart noted to the right 3rd toe. She states the symptoms have improved. She denies any pain at this time. Patient is here for further evaluation and treatment.   Past Medical History:  Diagnosis Date  . Allergy   . Arthritis   . Asthma   . History of chicken pox   . Hypertension     Objective: Physical Exam General: The patient is alert and oriented x3 in no acute distress.  Dermatology: Hyperkeratotic skin lesion noted to the third toe of the right foot approximately 1 cm in diameter. Pinpoint bleeding noted upon debridement. Skin is warm, dry and supple bilateral lower extremities. Negative for open lesions or macerations.  Vascular: Palpable pedal pulses bilaterally. No edema or erythema noted. Capillary refill within normal limits.  Neurological: Epicritic and protective threshold grossly intact bilaterally.   Musculoskeletal Exam: Pain on palpation to the note skin lesion.  Range of motion within normal limits to all pedal and ankle joints bilateral. Muscle strength 5/5 in all groups bilateral.   Assessment: #1 wart right 3rd toe  Plan of Care:  #1 Patient was evaluated. #2 Excisional debridement of the wart lesion was performed using a tissue nipper. Cantharone was applied and the lesion was dressed with a dry sterile dressing. #3 Prior to debridement a digital block was given using 2 mLs of plain Lidocaine 2% without epinephrine.  #4 Prescription for wart cream to be dispensed by [facility] provided to patient.  #5 Return to clinic as needed.   Scheduled for hip surgery on June 23rd.   Edrick Kins, DPM [facility]  Dr. Edrick Kins, [place]                                        [place], [place] [address]                Office [phone]  Fax [phone]

## 2018-03-30 NOTE — Telephone Encounter (Signed)
Returned call to patient and instructed her to use the Wart cream once daily.  She verbally understood

## 2018-04-12 ENCOUNTER — Telehealth: Payer: Self-pay | Admitting: Internal Medicine

## 2018-04-12 NOTE — Telephone Encounter (Signed)
I have faxed notes EKG etc to [facility], this is the 3rd time I have faxed and I have fax log sheet to confirm

## 2018-04-12 NOTE — Telephone Encounter (Signed)
Copied from [place] [id number]. Topic: Quick Communication - See Telephone Encounter >> Apr 12, 2018 12:08 PM Ether Griffins B wrote: CRM for notification. See Telephone encounter for: 04/12/18.  Sherry with [facility] calling checking on the surgical clearance form. Pt has pre op appt on 03/14/18. They are needing the form, labs and OV notes from that day. Please fax to [phone].

## 2018-04-18 ENCOUNTER — Encounter: Payer: Self-pay | Admitting: Podiatry

## 2018-05-08 ENCOUNTER — Encounter: Payer: Self-pay | Admitting: Internal Medicine

## 2018-05-08 ENCOUNTER — Ambulatory Visit: Payer: BC Managed Care – PPO | Admitting: Internal Medicine

## 2018-05-08 DIAGNOSIS — I10 Essential (primary) hypertension: Secondary | ICD-10-CM | POA: Diagnosis not present

## 2018-05-08 MED ORDER — HYDRALAZINE HCL 10 MG PO TABS: 10.0000 mg | ORAL_TABLET | Freq: Three times a day (TID) | ORAL | 0 refills | Status: DC

## 2018-05-08 NOTE — Patient Instructions (Signed)

## 2018-05-08 NOTE — Progress Notes (Signed)
Subjective:    Patient ID: Caitlyn Robertson, female    DOB: 06/17/1961, 57 y.o.   MRN: [id number]  HPI  Pt presents to the clinic today to follow up HTN. She is scheduled for right hip replacement. She is taking her Lisinopril 40 mg daily. She was started on Hydralazine 5 mg TID per her preop evaluation. ECG from 02/2018 reviewed. Her BP today is 172/108. Her blood pressure at home range 130's/80's. She has a history of white coat syndrome and she is in pain.  Review of Systems      Past Medical History:  Diagnosis Date  . Allergy   . Arthritis   . Asthma   . History of chicken pox   . Hypertension     Current Outpatient Medications  Medication Sig Dispense Refill  . fluticasone (FLOVENT HFA) 110 MCG/ACT inhaler Inhale 2 puffs into the lungs 2 (two) times daily. 1 Inhaler 12  . lisinopril (PRINIVIL,ZESTRIL) 40 MG tablet Take 1 tablet (40 mg total) by mouth daily. 60 tablet 0  . meloxicam (MOBIC) 15 MG tablet Take 1 tablet (15 mg total) by mouth daily. 30 tablet 2  . montelukast (SINGULAIR) 10 MG tablet Take 1 tablet (10 mg total) by mouth daily. 30 tablet 4  . NONFORMULARY OR COMPOUNDED ITEM See pharmacy note 120 each 1  . PROAIR HFA 108 (90 Base) MCG/ACT inhaler Inhale 1-2 puffs into the lungs every 4 (four) hours as needed for wheezing or shortness of breath. 1 Inhaler 11  . PROAIR HFA 108 (90 Base) MCG/ACT inhaler INHALE 1-2 PUFFS INTO THE LUNGS EVERY 4 (FOUR) HOURS AS NEEDED FOR WHEEZING OR SHORTNESS OF BREATH. 8.5 Inhaler 2   No current facility-administered medications for this visit.     No Known Allergies  Family History  Problem Relation Age of Onset  . Arthritis Mother   . Colon cancer Mother   . Stroke Mother   . Arthritis Father   . Asthma Father   . Diabetes Father   . Hypertension Father   . Asthma Daughter   . Stroke Maternal Grandmother   . Arthritis Maternal Grandfather   . Depression Maternal Grandfather   . Heart disease Paternal Grandfather   .  Arthritis Paternal Grandfather     Social History   Socioeconomic History  . Marital status: Single    Spouse name: Not on file  . Number of children: Not on file  . Years of education: Not on file  . Highest education level: Not on file  Occupational History  . Not on file  Social Needs  . Financial resource strain: Not on file  . Food insecurity:    Worry: Not on file    Inability: Not on file  . Transportation needs:    Medical: Not on file    Non-medical: Not on file  Tobacco Use  . Smoking status: Never Smoker  . Smokeless tobacco: Never Used  Substance and Sexual Activity  . Alcohol use: Yes    Comment: rare  . Drug use: No  . Sexual activity: Not on file  Lifestyle  . Physical activity:    Days per week: Not on file    Minutes per session: Not on file  . Stress: Not on file  Relationships  . Social connections:    Talks on phone: Not on file    Gets together: Not on file    Attends religious service: Not on file    Active member of club  or organization: Not on file    Attends meetings of clubs or organizations: Not on file    Relationship status: Not on file  . Intimate partner violence:    Fear of current or ex partner: Not on file    Emotionally abused: Not on file    Physically abused: Not on file    Forced sexual activity: Not on file  Other Topics Concern  . Not on file  Social History Narrative  . Not on file     Constitutional: Denies fever, malaise, fatigue, headache or abrupt weight changes.  Respiratory: Denies difficulty breathing, shortness of breath, cough or sputum production.   Cardiovascular: Denies chest pain, chest tightness, palpitations or swelling in the hands or feet.  Neurological: Denies dizziness, difficulty with memory, difficulty with speech or problems with balance and coordination.  Psych: Pt reports anxiety. Denies anxiety, depression, SI/HI.  No other specific complaints in a complete review of systems (except as listed  in HPI above).  Objective:   Physical Exam   BP (!) 172/108   Pulse (!) 102   Temp 98.1 F (36.7 C) (Oral)   Wt 206 lb (93.4 kg)   SpO2 98%   BMI 33.50 kg/m  Wt Readings from Last 3 Encounters:  05/08/18 206 lb (93.4 kg)  03/14/18 214 lb (97.1 kg)  02/21/18 213 lb (96.6 kg)    General: Appears her stated age, in NAD. Cardiovascular: Normal rate and rhythm. S1,S2 noted.  No murmur, rubs or gallops noted. Pulmonary/Chest: Normal effort and positive vesicular breath sounds. No respiratory distress. No wheezes, rales or ronchi noted.  Neurological: Alert and oriented.   BMET    Component Value Date/Time   NA 142 03/14/2018 1606   K 3.6 03/14/2018 1606   CL 107 03/14/2018 1606   CO2 26 03/14/2018 1606   GLUCOSE 97 03/14/2018 1606   BUN 16 03/14/2018 1606   CREATININE 0.82 03/14/2018 1606   CALCIUM 9.5 03/14/2018 1606    Lipid Panel     Component Value Date/Time   CHOL 176 10/10/2017 0847   TRIG 47.0 10/10/2017 0847   HDL 67.70 10/10/2017 0847   CHOLHDL 3 10/10/2017 0847   VLDL 9.4 10/10/2017 0847   LDLCALC 99 10/10/2017 0847    CBC    Component Value Date/Time   WBC 9.1 03/14/2018 1606   RBC 4.93 03/14/2018 1606   HGB 14.6 03/14/2018 1606   HCT 42.9 03/14/2018 1606   PLT 233.0 03/14/2018 1606   MCV 87.1 03/14/2018 1606   MCHC 34.0 03/14/2018 1606   RDW 14.9 03/14/2018 1606    Hgb A1C Lab Results  Component Value Date   HGBA1C 5.8 03/14/2018           Assessment & Plan:

## 2018-05-08 NOTE — Assessment & Plan Note (Signed)
Elevated at doctor's office, mildly elevated at home Increase Hydralazine to 10 mg TID, continue Lisinopril Reinforced DASH diet  Will monitor blood pressure

## 2018-05-19 ENCOUNTER — Other Ambulatory Visit: Payer: Self-pay | Admitting: Internal Medicine

## 2018-05-24 ENCOUNTER — Encounter: Payer: Self-pay | Admitting: Internal Medicine

## 2018-05-24 MED ORDER — HYDRALAZINE HCL 10 MG PO TABS: 10.0000 mg | ORAL_TABLET | Freq: Three times a day (TID) | ORAL | 1 refills | Status: DC

## 2018-06-12 ENCOUNTER — Other Ambulatory Visit: Payer: Self-pay | Admitting: Internal Medicine

## 2018-07-10 ENCOUNTER — Other Ambulatory Visit: Payer: Self-pay | Admitting: Internal Medicine

## 2018-07-11 ENCOUNTER — Ambulatory Visit: Payer: BC Managed Care – PPO | Admitting: Podiatry

## 2018-07-11 ENCOUNTER — Encounter: Payer: Self-pay | Admitting: Podiatry

## 2018-07-11 ENCOUNTER — Encounter

## 2018-07-11 DIAGNOSIS — B070 Plantar wart: Secondary | ICD-10-CM

## 2018-07-13 NOTE — Progress Notes (Signed)
   Subjective: 57 year old female presenting today for follow up evaluation of a wart noted to the right 3rd toe. She states the wart returned but is not as big as it was prior. She reports continued pain when walking. Patient is here for further evaluation and treatment.   Past Medical History:  Diagnosis Date  . Allergy   . Arthritis   . Asthma   . History of chicken pox   . Hypertension     Objective: Physical Exam General: The patient is alert and oriented x3 in no acute distress.  Dermatology: Hyperkeratotic skin lesion noted to the third toe of the right foot approximately 1 cm in diameter. Pinpoint bleeding noted upon debridement. Skin is warm, dry and supple bilateral lower extremities. Negative for open lesions or macerations.  Vascular: Palpable pedal pulses bilaterally. No edema or erythema noted. Capillary refill within normal limits.  Neurological: Epicritic and protective threshold grossly intact bilaterally.   Musculoskeletal Exam: Pain on palpation to the note skin lesion.  Range of motion within normal limits to all pedal and ankle joints bilateral. Muscle strength 5/5 in all groups bilateral.   Assessment: #1 wart right 3rd toe  Plan of Care:  #1 Patient was evaluated. #2 Excisional debridement of the wart lesion was performed using a tissue nipper. Cantharone was applied and the lesion was dressed with a dry sterile dressing. #3 Return to clinic as needed.     Edrick Kins, DPM [facility]  Dr. Edrick Kins, [address]                                        [place], [place] [address]                Office [phone]  Fax [phone]

## 2018-07-17 ENCOUNTER — Encounter: Payer: Self-pay | Admitting: Internal Medicine

## 2018-07-17 DIAGNOSIS — I10 Essential (primary) hypertension: Secondary | ICD-10-CM

## 2018-07-17 MED ORDER — LISINOPRIL 40 MG PO TABS: 40.0000 mg | ORAL_TABLET | Freq: Every day | ORAL | 2 refills | Status: DC

## 2018-07-25 ENCOUNTER — Encounter: Payer: Self-pay | Admitting: Podiatry

## 2018-07-25 ENCOUNTER — Ambulatory Visit: Payer: BC Managed Care – PPO | Admitting: Podiatry

## 2018-07-29 ENCOUNTER — Other Ambulatory Visit: Payer: Self-pay | Admitting: Internal Medicine

## 2018-08-01 ENCOUNTER — Ambulatory Visit (INDEPENDENT_AMBULATORY_CARE_PROVIDER_SITE_OTHER): Payer: BC Managed Care – PPO | Admitting: Podiatry

## 2018-08-01 ENCOUNTER — Encounter: Payer: Self-pay | Admitting: Podiatry

## 2018-08-01 DIAGNOSIS — B070 Plantar wart: Secondary | ICD-10-CM

## 2018-08-03 NOTE — Progress Notes (Signed)
   HPI: 57 year old female presenting today for follow up evaluation of a plantar wart of the right third toe. She states the area has improved. She denies any pain or modifying factors. Patient is here for further evaluation and treatment.   Past Medical History:  Diagnosis Date  . Allergy   . Arthritis   . Asthma   . History of chicken pox   . Hypertension      Physical Exam: General: The patient is alert and oriented x3 in no acute distress.  Dermatology: Skin is warm, dry and supple bilateral lower extremities. Negative for open lesions or macerations.  Vascular: Palpable pedal pulses bilaterally. No edema or erythema noted. Capillary refill within normal limits.  Neurological: Epicritic and protective threshold grossly intact bilaterally.   Musculoskeletal Exam: Range of motion within normal limits to all pedal and ankle joints bilateral. Muscle strength 5/5 in all groups bilateral.   Assessment: 1. Wart right third toe   Plan of Care:  1. Patient evaluated.   2. Light debridement of area.  3. Continue using wart cream dispensed by [facility].  4. Return to clinic as needed.       Edrick Kins, DPM [facility]  Dr. Edrick Kins, DPM    [address], [place] [address]                Office [phone]  Fax [phone]

## 2018-08-20 ENCOUNTER — Other Ambulatory Visit: Payer: Self-pay | Admitting: Internal Medicine

## 2018-08-28 ENCOUNTER — Other Ambulatory Visit: Payer: Self-pay | Admitting: Internal Medicine

## 2018-09-01 NOTE — Telephone Encounter (Signed)
Well, that is good to hear. Look forward to seeing you at your next office visit.

## 2018-09-05 ENCOUNTER — Encounter: Payer: Self-pay | Admitting: Internal Medicine

## 2018-09-22 ENCOUNTER — Other Ambulatory Visit: Payer: Self-pay | Admitting: Internal Medicine

## 2018-09-25 ENCOUNTER — Other Ambulatory Visit: Payer: Self-pay | Admitting: Internal Medicine

## 2018-09-29 ENCOUNTER — Telehealth: Payer: Self-pay | Admitting: Internal Medicine

## 2018-09-29 NOTE — Telephone Encounter (Signed)
Caitlyn Robertson @ [facility] make pt surgical clearance appointment for 11/12  She will let pt know

## 2018-09-29 NOTE — Telephone Encounter (Signed)
Caitlyn Robertson with [facility] called regarding surgical clearance paperwork that  was faxed 08/17/18  Best number [phone] ext [phone]   Surgery is 11/15 with bowers left total hip replacement  Form was refaxed again today

## 2018-10-03 ENCOUNTER — Ambulatory Visit (INDEPENDENT_AMBULATORY_CARE_PROVIDER_SITE_OTHER): Payer: BC Managed Care – PPO | Admitting: Internal Medicine

## 2018-10-03 ENCOUNTER — Encounter: Payer: Self-pay | Admitting: Internal Medicine

## 2018-10-03 VITALS — BP 158/100 | HR 94 | Temp 98.1°F | Wt 214.0 lb

## 2018-10-03 DIAGNOSIS — Z01818 Encounter for other preprocedural examination: Secondary | ICD-10-CM | POA: Diagnosis not present

## 2018-10-03 DIAGNOSIS — M160 Bilateral primary osteoarthritis of hip: Secondary | ICD-10-CM | POA: Diagnosis not present

## 2018-10-03 DIAGNOSIS — I10 Essential (primary) hypertension: Secondary | ICD-10-CM | POA: Diagnosis not present

## 2018-10-03 NOTE — Patient Instructions (Signed)

## 2018-10-03 NOTE — Progress Notes (Signed)
Subjective:    Patient ID: Caitlyn Robertson, female    DOB: 10-11-1961, 57 y.o.   MRN: [id number]  HPI  Pt presents to the clinic today for preop screening and follow up HTN. She has planned left total hip arthroplasty on 10/06/18 by Dr. Harlow Mares. She had the right hip replacement on the right 6/21. She has HTN, mostly white coat and pain related. Her BP today is 160/94. She is taking Lisinopril and Hydralazine as prescribed. Her BP at home runs 118/65. ECG from 02/2018 reviewed.  Review of Systems      Past Medical History:  Diagnosis Date  . Allergy   . Arthritis   . Asthma   . History of chicken pox   . Hypertension     Current Outpatient Medications  Medication Sig Dispense Refill  . FLOVENT HFA 110 MCG/ACT inhaler INHALE 2 PUFFS INTO THE LUNGS 2 (TWO) TIMES DAILY. MUST SCHEDULE ANNUAL PHYSICAL 12 Inhaler 0  . hydrALAZINE (APRESOLINE) 10 MG tablet TAKE 1 TABLET BY MOUTH THREE TIMES A DAY 90 tablet 0  . lisinopril (PRINIVIL,ZESTRIL) 40 MG tablet Take 1 tablet (40 mg total) by mouth daily. 60 tablet 2  . montelukast (SINGULAIR) 10 MG tablet TAKE 1 TABLET BY MOUTH EVERY DAY 30 tablet 1  . NONFORMULARY OR COMPOUNDED ITEM See pharmacy note 120 each 1  . PROAIR HFA 108 (90 Base) MCG/ACT inhaler INHALE 1-2 PUFFS INTO THE LUNGS EVERY 4 (FOUR) HOURS AS NEEDED FOR WHEEZING OR SHORTNESS OF BREATH. 8.5 Inhaler 2  . meloxicam (MOBIC) 15 MG tablet TAKE 1 TABLET BY MOUTH EVERY DAY (Patient not taking: Reported on 10/03/2018) 30 tablet 1   No current facility-administered medications for this visit.     Allergies  Allergen Reactions  . Oxycodone     Family History  Problem Relation Age of Onset  . Arthritis Mother   . Colon cancer Mother   . Stroke Mother   . Arthritis Father   . Asthma Father   . Diabetes Father   . Hypertension Father   . Asthma Daughter   . Stroke Maternal Grandmother   . Arthritis Maternal Grandfather   . Depression Maternal Grandfather   . Heart disease  Paternal Grandfather   . Arthritis Paternal Grandfather     Social History   Socioeconomic History  . Marital status: Single    Spouse name: Not on file  . Number of children: Not on file  . Years of education: Not on file  . Highest education level: Not on file  Occupational History  . Not on file  Social Needs  . Financial resource strain: Not on file  . Food insecurity:    Worry: Not on file    Inability: Not on file  . Transportation needs:    Medical: Not on file    Non-medical: Not on file  Tobacco Use  . Smoking status: Never Smoker  . Smokeless tobacco: Never Used  Substance and Sexual Activity  . Alcohol use: Yes    Comment: rare  . Drug use: No  . Sexual activity: Not on file  Lifestyle  . Physical activity:    Days per week: Not on file    Minutes per session: Not on file  . Stress: Not on file  Relationships  . Social connections:    Talks on phone: Not on file    Gets together: Not on file    Attends religious service: Not on file    Active member  of club or organization: Not on file    Attends meetings of clubs or organizations: Not on file    Relationship status: Not on file  . Intimate partner violence:    Fear of current or ex partner: Not on file    Emotionally abused: Not on file    Physically abused: Not on file    Forced sexual activity: Not on file  Other Topics Concern  . Not on file  Social History Narrative  . Not on file     Constitutional: Denies fever, malaise, fatigue, headache or abrupt weight changes.  Respiratory: Denies difficulty breathing, shortness of breath, cough or sputum production.   Cardiovascular: Denies chest pain, chest tightness, palpitations or swelling in the hands or feet.  Musculoskeletal: Pt reports left hip pain. Denies decrease in range of motion, difficulty with gait, muscle pain or joint swelling.  Neurological: Denies dizziness, difficulty with memory, difficulty with speech or problems with balance and  coordination.   No other specific complaints in a complete review of systems (except as listed in HPI above).  Objective:   Physical Exam  BP (!) 160/94   Pulse 94   Temp 98.1 F (36.7 C) (Oral)   Wt 214 lb (97.1 kg)   SpO2 98%   BMI 34.80 kg/m  Wt Readings from Last 3 Encounters:  10/03/18 214 lb (97.1 kg)  05/08/18 206 lb (93.4 kg)  03/14/18 214 lb (97.1 kg)    General: Appears her stated age, obese, in NAD. Cardiovascular: Normal rate and rhythm. S1,S2 noted.  No murmur, rubs or gallops noted.  Pulmonary/Chest: Normal effort and positive vesicular breath sounds. No respiratory distress. No wheezes, rales or ronchi noted.  Musculoskeletal: Pain with internal and external rotation of the left hip. Strength 5/5 BLE. No difficulty with gait.  Neurological: Alert and oriented.   BMET    Component Value Date/Time   NA 142 03/14/2018 1606   K 3.6 03/14/2018 1606   CL 107 03/14/2018 1606   CO2 26 03/14/2018 1606   GLUCOSE 97 03/14/2018 1606   BUN 16 03/14/2018 1606   CREATININE 0.82 03/14/2018 1606   CALCIUM 9.5 03/14/2018 1606    Lipid Panel     Component Value Date/Time   CHOL 176 10/10/2017 0847   TRIG 47.0 10/10/2017 0847   HDL 67.70 10/10/2017 0847   CHOLHDL 3 10/10/2017 0847   VLDL 9.4 10/10/2017 0847   LDLCALC 99 10/10/2017 0847    CBC    Component Value Date/Time   WBC 9.1 03/14/2018 1606   RBC 4.93 03/14/2018 1606   HGB 14.6 03/14/2018 1606   HCT 42.9 03/14/2018 1606   PLT 233.0 03/14/2018 1606   MCV 87.1 03/14/2018 1606   MCHC 34.0 03/14/2018 1606   RDW 14.9 03/14/2018 1606    Hgb A1C Lab Results  Component Value Date   HGBA1C 5.8 03/14/2018            Assessment & Plan:   Preoperative Cardiovascular Screening, HTN:  ECG today Indication for ECG: preop screening, HTN Findings: Normal, no change from prior Comparison: 02/2018 Continue Lisinopril and Hydralazine for now, will readdress after surgery Labs from 02/2018 reviewed, no  need to repeat at this time  Make an appt for your annual exam Webb Silversmith, NP

## 2018-10-04 ENCOUNTER — Telehealth: Payer: Self-pay | Admitting: Internal Medicine

## 2018-10-04 NOTE — Telephone Encounter (Signed)
Wanting to know the status of clearance for pt's surgery for Friday. Please fax any notes and labs from clearance to Fax # [phone]

## 2018-10-04 NOTE — Telephone Encounter (Signed)
Has been faxed twice

## 2018-10-09 ENCOUNTER — Encounter: Payer: Self-pay | Admitting: Internal Medicine

## 2018-10-14 ENCOUNTER — Other Ambulatory Visit: Payer: Self-pay | Admitting: Internal Medicine

## 2018-10-16 ENCOUNTER — Encounter: Payer: Self-pay | Admitting: Internal Medicine

## 2018-10-16 MED ORDER — HYDRALAZINE HCL 10 MG PO TABS: 10.0000 mg | ORAL_TABLET | Freq: Three times a day (TID) | ORAL | 1 refills | Status: DC

## 2018-10-29 ENCOUNTER — Other Ambulatory Visit: Payer: Self-pay | Admitting: Internal Medicine

## 2018-11-06 ENCOUNTER — Encounter: Payer: Self-pay | Admitting: Internal Medicine

## 2018-11-27 IMAGING — DX DG HIP (WITH OR WITHOUT PELVIS) 2-3V*L*
2 series · 2 of 2 positions shown · non-contrast
Comparison: None in PACs

CLINICAL DATA: Bilateral hip pain

EXAM:
DG HIP (WITH OR WITHOUT PELVIS) 2-3V RIGHT; DG HIP (WITH OR WITHOUT
PELVIS) 2-3V LEFT

[hip ap]
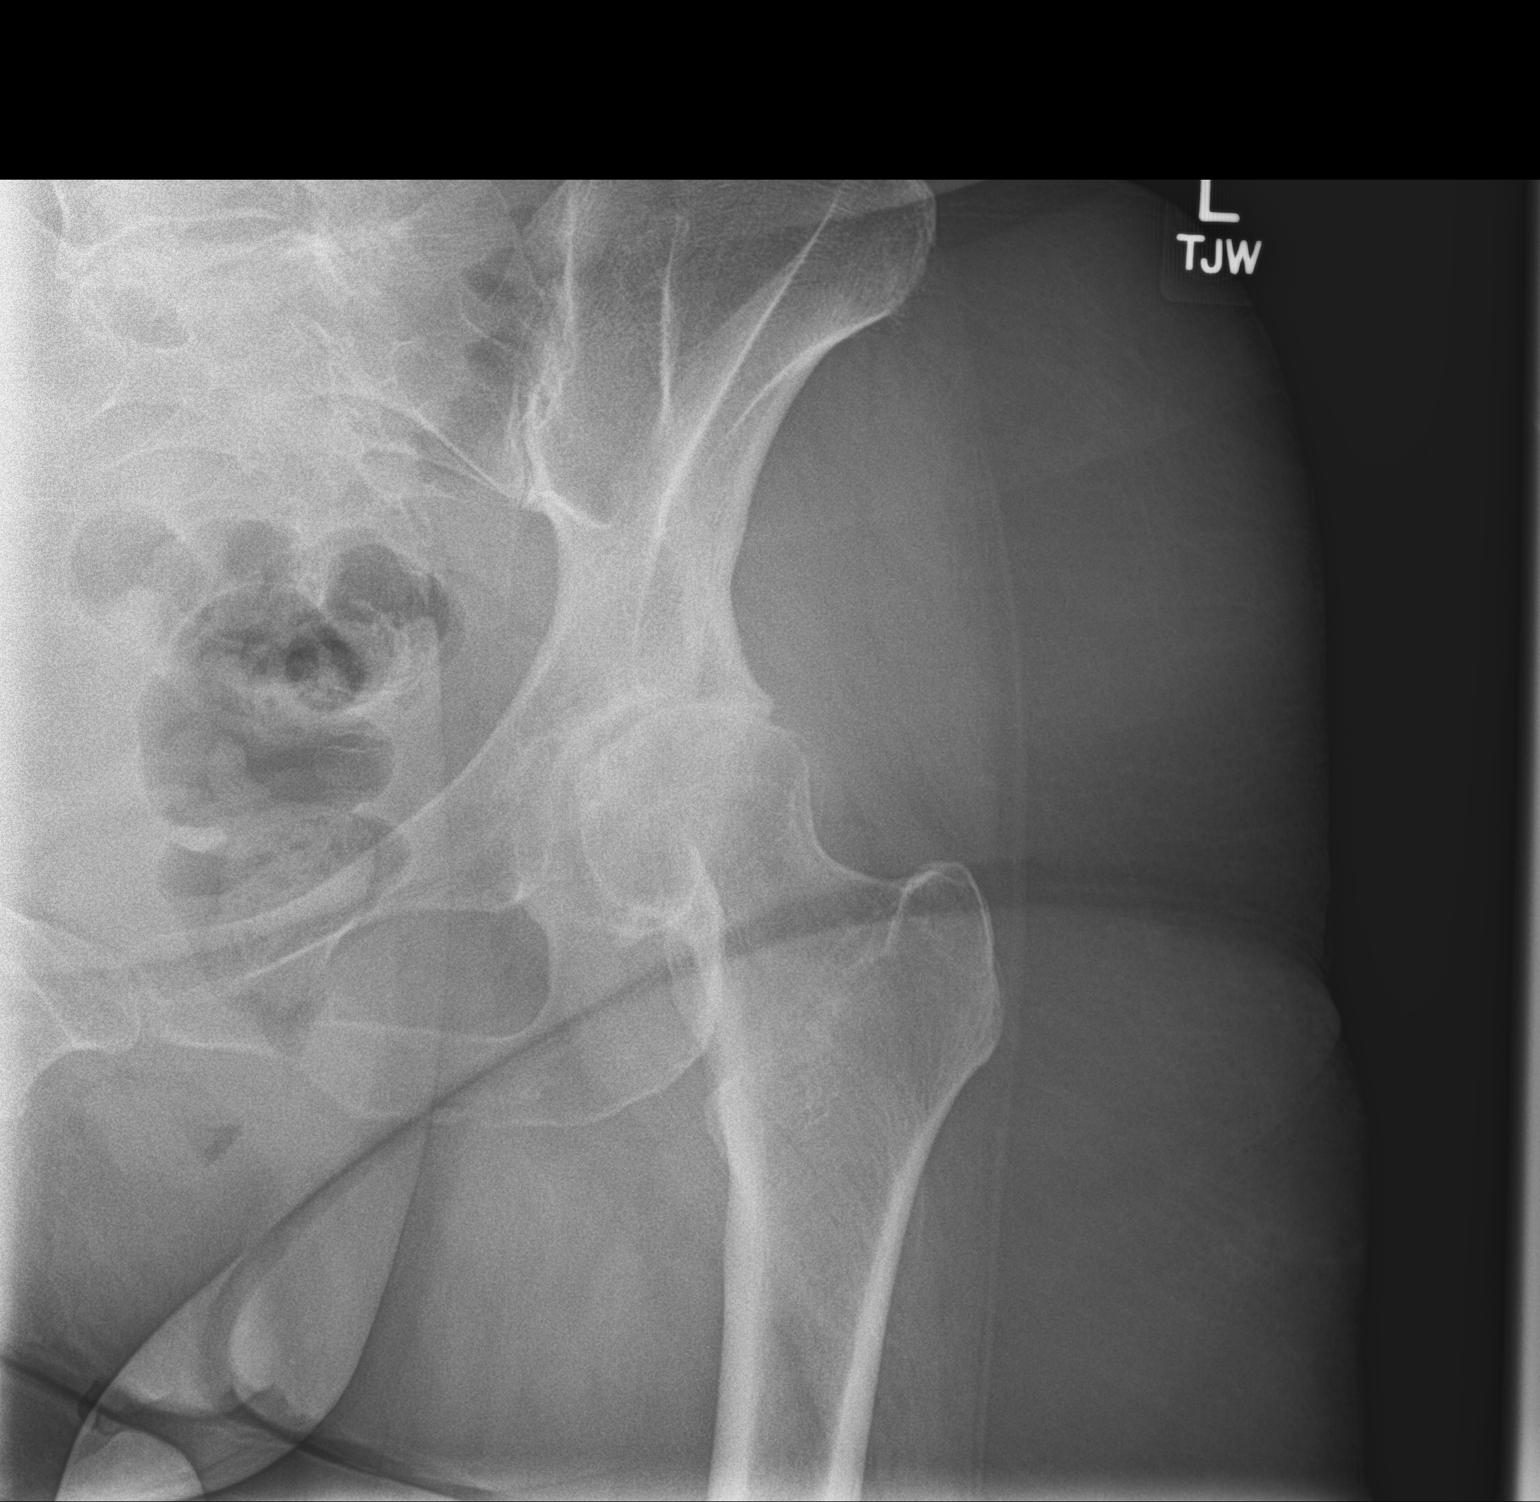

[hip lat]
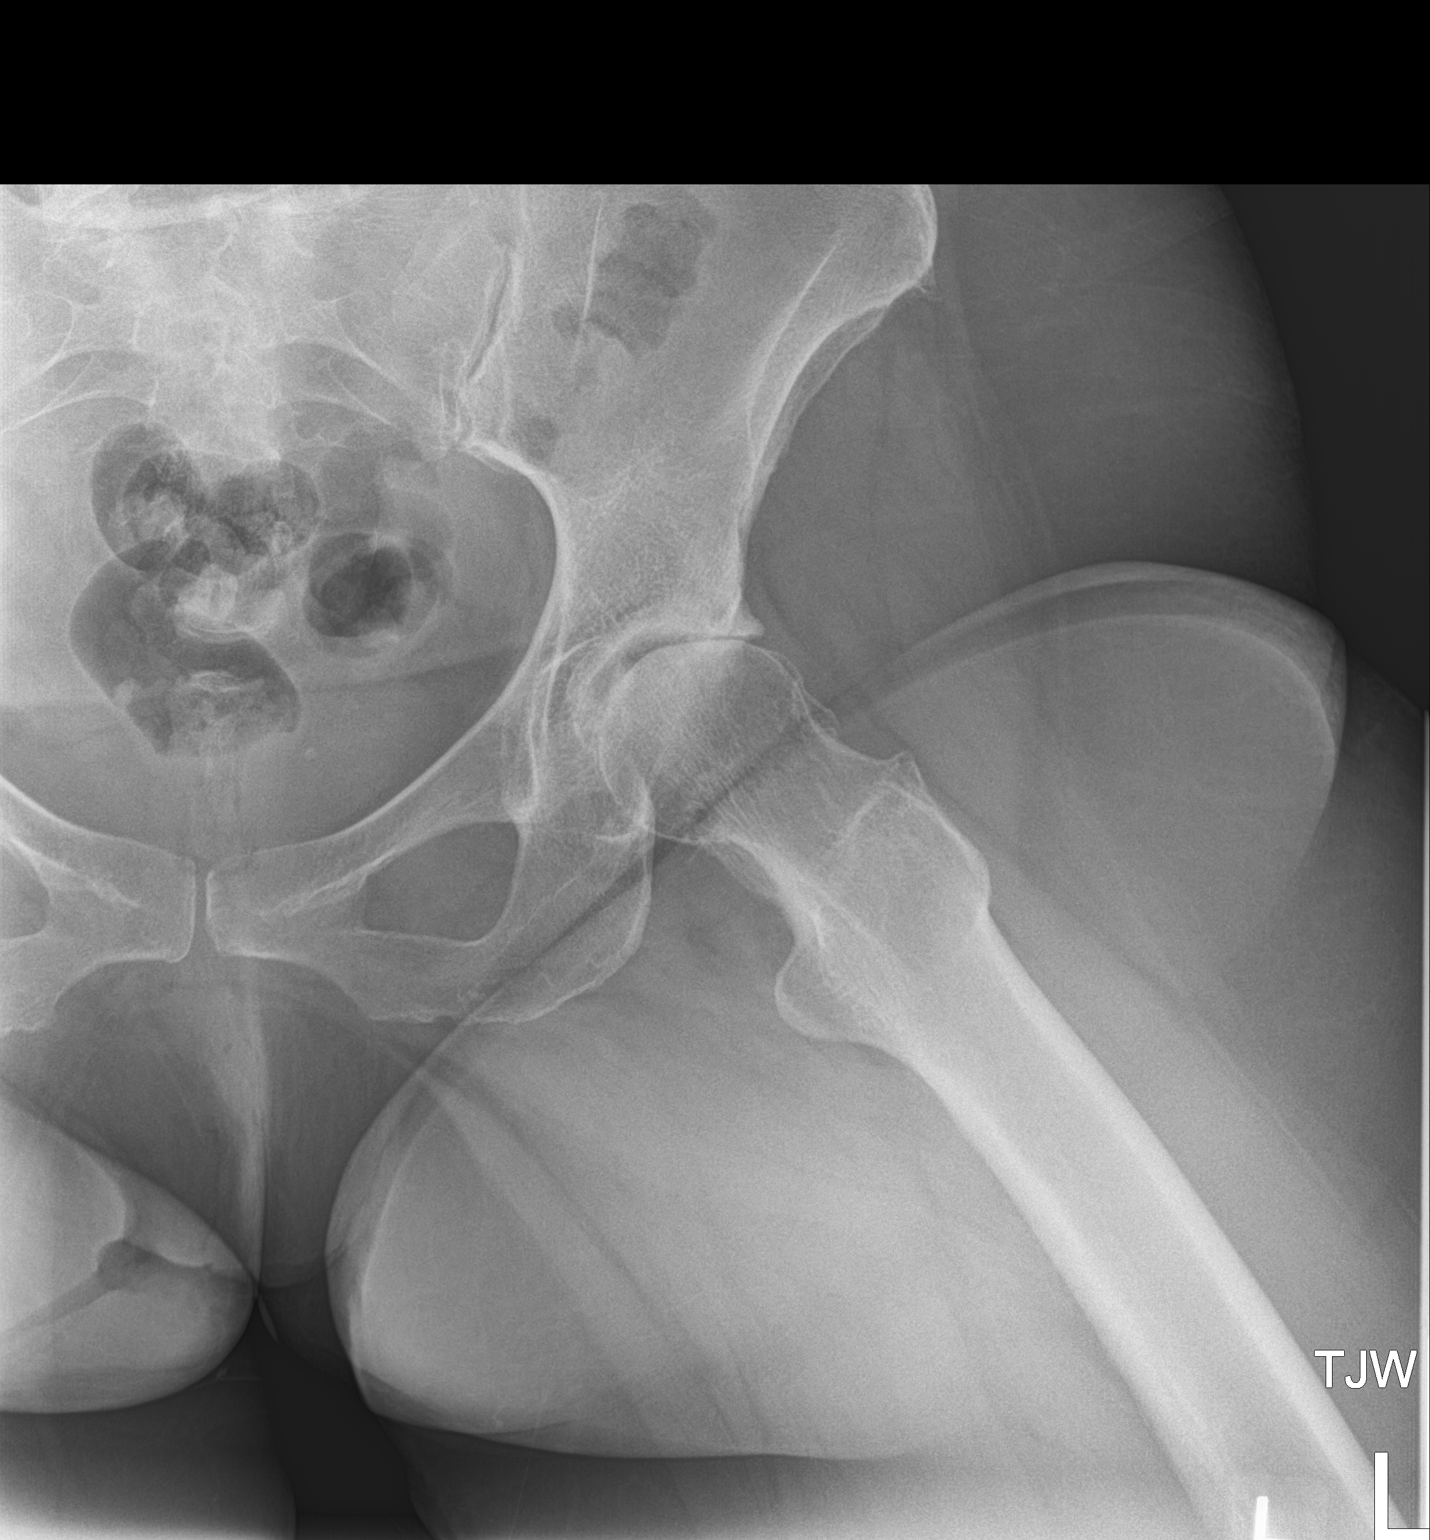

[2 of 2 positions shown; findings below may reference images not displayed]

FINDINGS: AP pelvis: The bony pelvis is subjectively adequately mineralized.
There is no lytic nor blastic lesion nor acute fracture.

Right hip: There is severe joint space loss superiorly with
flattening of the articular surface of the right femoral head. There
is a bone on bone appearance. There are subarticular cystic changes
in the femoral head. The femoral neck, intertrochanteric, and
subtrochanteric regions are normal.

Left hip: There is moderate joint space loss superiorly. The
articular surfaces of the left femoral head and acetabulum remain
smoothly rounded. The femoral neck, intertrochanteric, and
subtrochanteric regions are normal.
IMPRESSION: Severe osteoarthritic joint space loss with bone on bone appearance
of the right hip. Moderate osteoarthritic joint space loss of the
left hip. No acute bony abnormality.

## 2018-12-02 ENCOUNTER — Other Ambulatory Visit: Payer: Self-pay | Admitting: Internal Medicine

## 2018-12-05 ENCOUNTER — Other Ambulatory Visit: Payer: Self-pay | Admitting: Internal Medicine

## 2018-12-27 ENCOUNTER — Other Ambulatory Visit: Payer: Self-pay | Admitting: Internal Medicine

## 2019-01-05 ENCOUNTER — Other Ambulatory Visit: Payer: Self-pay | Admitting: Internal Medicine

## 2019-01-05 DIAGNOSIS — I10 Essential (primary) hypertension: Secondary | ICD-10-CM

## 2019-02-03 ENCOUNTER — Other Ambulatory Visit: Payer: Self-pay | Admitting: Internal Medicine

## 2019-02-06 ENCOUNTER — Encounter: Payer: Self-pay | Admitting: Internal Medicine

## 2019-02-06 DIAGNOSIS — I10 Essential (primary) hypertension: Secondary | ICD-10-CM

## 2019-02-09 MED ORDER — LISINOPRIL 40 MG PO TABS: 40.0000 mg | ORAL_TABLET | Freq: Every day | ORAL | 1 refills | Status: DC

## 2019-02-12 MED ORDER — ALBUTEROL SULFATE HFA 108 (90 BASE) MCG/ACT IN AERS: 1.0000 | INHALATION_SPRAY | Freq: Four times a day (QID) | RESPIRATORY_TRACT | 3 refills | Status: DC | PRN

## 2019-02-12 NOTE — Addendum Note (Signed)
Addended by: Jearld Fenton on: 02/12/2019 01:46 PM   Modules accepted: Orders

## 2019-02-19 ENCOUNTER — Encounter: Payer: Self-pay | Admitting: Internal Medicine

## 2019-04-06 ENCOUNTER — Other Ambulatory Visit: Payer: Self-pay | Admitting: Internal Medicine

## 2019-04-06 DIAGNOSIS — I10 Essential (primary) hypertension: Secondary | ICD-10-CM

## 2019-04-12 ENCOUNTER — Other Ambulatory Visit: Payer: Self-pay | Admitting: Internal Medicine

## 2019-04-25 ENCOUNTER — Encounter: Payer: Self-pay | Admitting: Internal Medicine

## 2019-04-25 MED ORDER — FLUTICASONE PROPIONATE HFA 110 MCG/ACT IN AERO: INHALATION_SPRAY | RESPIRATORY_TRACT | 1 refills | Status: DC

## 2019-05-05 ENCOUNTER — Other Ambulatory Visit: Payer: Self-pay | Admitting: Internal Medicine

## 2019-05-05 DIAGNOSIS — I10 Essential (primary) hypertension: Secondary | ICD-10-CM

## 2019-06-05 ENCOUNTER — Encounter: Payer: Self-pay | Admitting: Internal Medicine

## 2019-06-12 ENCOUNTER — Other Ambulatory Visit: Payer: Self-pay | Admitting: Internal Medicine

## 2019-06-12 DIAGNOSIS — I10 Essential (primary) hypertension: Secondary | ICD-10-CM

## 2019-06-28 ENCOUNTER — Other Ambulatory Visit: Payer: Self-pay | Admitting: Internal Medicine

## 2019-07-17 ENCOUNTER — Other Ambulatory Visit: Payer: Self-pay | Admitting: Internal Medicine

## 2019-07-17 DIAGNOSIS — I10 Essential (primary) hypertension: Secondary | ICD-10-CM

## 2019-07-29 ENCOUNTER — Other Ambulatory Visit: Payer: Self-pay | Admitting: Internal Medicine

## 2019-08-02 ENCOUNTER — Encounter: Payer: Self-pay | Admitting: Internal Medicine

## 2019-08-02 ENCOUNTER — Other Ambulatory Visit: Payer: Self-pay | Admitting: Internal Medicine

## 2019-08-02 MED ORDER — PROAIR HFA 108 (90 BASE) MCG/ACT IN AERS: INHALATION_SPRAY | RESPIRATORY_TRACT | 1 refills | Status: DC

## 2019-08-16 ENCOUNTER — Other Ambulatory Visit: Payer: Self-pay | Admitting: Internal Medicine

## 2019-08-16 DIAGNOSIS — I10 Essential (primary) hypertension: Secondary | ICD-10-CM

## 2019-09-01 ENCOUNTER — Other Ambulatory Visit: Payer: Self-pay | Admitting: Internal Medicine

## 2019-09-02 ENCOUNTER — Other Ambulatory Visit: Payer: Self-pay | Admitting: Internal Medicine

## 2019-09-27 ENCOUNTER — Other Ambulatory Visit: Payer: Self-pay | Admitting: Internal Medicine

## 2019-10-22 ENCOUNTER — Encounter: Payer: Self-pay | Admitting: Internal Medicine

## 2019-10-22 DIAGNOSIS — I10 Essential (primary) hypertension: Secondary | ICD-10-CM

## 2019-10-25 MED ORDER — MONTELUKAST SODIUM 10 MG PO TABS: ORAL_TABLET | ORAL | 1 refills | Status: DC

## 2019-10-25 MED ORDER — LISINOPRIL 40 MG PO TABS: 40.0000 mg | ORAL_TABLET | Freq: Every day | ORAL | 1 refills | Status: DC

## 2019-10-26 ENCOUNTER — Other Ambulatory Visit: Payer: Self-pay | Admitting: Internal Medicine

## 2019-11-09 ENCOUNTER — Other Ambulatory Visit: Payer: Self-pay | Admitting: Internal Medicine

## 2019-11-23 ENCOUNTER — Other Ambulatory Visit: Payer: Self-pay | Admitting: Internal Medicine

## 2019-12-08 ENCOUNTER — Other Ambulatory Visit: Payer: Self-pay | Admitting: Internal Medicine

## 2019-12-08 DIAGNOSIS — I10 Essential (primary) hypertension: Secondary | ICD-10-CM

## 2019-12-24 ENCOUNTER — Encounter: Payer: Self-pay | Admitting: Internal Medicine

## 2019-12-24 MED ORDER — ALBUTEROL SULFATE HFA 108 (90 BASE) MCG/ACT IN AERS: 1.0000 | INHALATION_SPRAY | RESPIRATORY_TRACT | 0 refills | Status: DC | PRN

## 2019-12-27 ENCOUNTER — Other Ambulatory Visit: Payer: Self-pay | Admitting: Internal Medicine

## 2020-01-09 ENCOUNTER — Other Ambulatory Visit: Payer: Self-pay | Admitting: Internal Medicine

## 2020-01-10 ENCOUNTER — Other Ambulatory Visit: Payer: Self-pay | Admitting: Internal Medicine

## 2020-01-10 DIAGNOSIS — I10 Essential (primary) hypertension: Secondary | ICD-10-CM

## 2020-01-21 ENCOUNTER — Other Ambulatory Visit: Payer: Self-pay | Admitting: Internal Medicine

## 2020-01-24 ENCOUNTER — Encounter: Payer: Self-pay | Admitting: Internal Medicine

## 2020-01-24 DIAGNOSIS — I10 Essential (primary) hypertension: Secondary | ICD-10-CM

## 2020-01-24 MED ORDER — FLOVENT HFA 110 MCG/ACT IN AERO: 2.0000 | INHALATION_SPRAY | Freq: Two times a day (BID) | RESPIRATORY_TRACT | 0 refills | Status: DC

## 2020-01-24 MED ORDER — ALBUTEROL SULFATE HFA 108 (90 BASE) MCG/ACT IN AERS: 1.0000 | INHALATION_SPRAY | RESPIRATORY_TRACT | 0 refills | Status: DC | PRN

## 2020-01-24 MED ORDER — LISINOPRIL 40 MG PO TABS: 40.0000 mg | ORAL_TABLET | Freq: Every day | ORAL | 0 refills | Status: DC

## 2020-01-24 MED ORDER — MONTELUKAST SODIUM 10 MG PO TABS: 10.0000 mg | ORAL_TABLET | Freq: Every day | ORAL | 0 refills | Status: DC

## 2020-02-24 ENCOUNTER — Other Ambulatory Visit: Payer: Self-pay | Admitting: Internal Medicine

## 2020-04-20 ENCOUNTER — Other Ambulatory Visit: Payer: Self-pay | Admitting: Internal Medicine

## 2020-04-23 ENCOUNTER — Other Ambulatory Visit: Payer: Self-pay | Admitting: Internal Medicine

## 2020-04-23 ENCOUNTER — Encounter: Payer: Self-pay | Admitting: Internal Medicine

## 2020-04-23 DIAGNOSIS — I10 Essential (primary) hypertension: Secondary | ICD-10-CM

## 2020-04-24 MED ORDER — LISINOPRIL 40 MG PO TABS: 40.0000 mg | ORAL_TABLET | Freq: Every day | ORAL | 0 refills | Status: DC

## 2020-05-08 ENCOUNTER — Encounter: Payer: Self-pay | Admitting: Internal Medicine

## 2020-05-08 ENCOUNTER — Other Ambulatory Visit: Payer: Self-pay

## 2020-05-08 ENCOUNTER — Ambulatory Visit (INDEPENDENT_AMBULATORY_CARE_PROVIDER_SITE_OTHER): Payer: BC Managed Care – PPO | Admitting: Internal Medicine

## 2020-05-08 VITALS — BP 148/96 | HR 76 | Temp 97.1°F | Ht 65.5 in | Wt 237.0 lb

## 2020-05-08 DIAGNOSIS — Z1231 Encounter for screening mammogram for malignant neoplasm of breast: Secondary | ICD-10-CM

## 2020-05-08 DIAGNOSIS — Z780 Asymptomatic menopausal state: Secondary | ICD-10-CM

## 2020-05-08 DIAGNOSIS — I10 Essential (primary) hypertension: Secondary | ICD-10-CM | POA: Diagnosis not present

## 2020-05-08 DIAGNOSIS — M160 Bilateral primary osteoarthritis of hip: Secondary | ICD-10-CM | POA: Diagnosis not present

## 2020-05-08 DIAGNOSIS — Z1211 Encounter for screening for malignant neoplasm of colon: Secondary | ICD-10-CM

## 2020-05-08 DIAGNOSIS — J4530 Mild persistent asthma, uncomplicated: Secondary | ICD-10-CM | POA: Diagnosis not present

## 2020-05-08 DIAGNOSIS — Z0000 Encounter for general adult medical examination without abnormal findings: Secondary | ICD-10-CM | POA: Diagnosis not present

## 2020-05-08 LAB — CBC
HCT: 43.8 % (ref 36.0–46.0)
Hemoglobin: 14.5 g/dL (ref 12.0–15.0)
MCHC: 33 g/dL (ref 30.0–36.0)
MCV: 87.1 fl (ref 78.0–100.0)
Platelets: 218 10*3/uL (ref 150.0–400.0)
RBC: 5.04 Mil/uL (ref 3.87–5.11)
RDW: 15.5 % (ref 11.5–15.5)
WBC: 7 10*3/uL (ref 4.0–10.5)

## 2020-05-08 LAB — COMPREHENSIVE METABOLIC PANEL
ALT: 18 U/L (ref 0–35)
AST: 14 U/L (ref 0–37)
Albumin: 4.3 g/dL (ref 3.5–5.2)
Alkaline Phosphatase: 56 U/L (ref 39–117)
BUN: 12 mg/dL (ref 6–23)
CO2: 28 mEq/L (ref 19–32)
Calcium: 9.2 mg/dL (ref 8.4–10.5)
Chloride: 106 mEq/L (ref 96–112)
Creatinine, Ser: 0.75 mg/dL (ref 0.40–1.20)
GFR: 78.87 mL/min (ref >60.00)
Glucose, Bld: 97 mg/dL (ref 70–99)
Potassium: 3.7 mEq/L (ref 3.5–5.1)
Sodium: 141 mEq/L (ref 135–145)
Total Bilirubin: 0.4 mg/dL (ref 0.2–1.2)
Total Protein: 7.1 g/dL (ref 6.0–8.3)

## 2020-05-08 LAB — LIPID PANEL
Cholesterol: 156 mg/dL (ref 0–200)
HDL: 61.7 mg/dL (ref >39.00)
LDL Cholesterol: 85 mg/dL (ref 0–99)
NonHDL: 94.5
Total CHOL/HDL Ratio: 3
Triglycerides: 46 mg/dL (ref 0.0–149.0)
VLDL: 9.2 mg/dL (ref 0.0–40.0)

## 2020-05-08 LAB — VITAMIN D 25 HYDROXY (VIT D DEFICIENCY, FRACTURES): VITD: 36.28 ng/mL (ref 30.00–100.00)

## 2020-05-08 LAB — HEMOGLOBIN A1C: Hgb A1c MFr Bld: 5.9 % (ref 4.6–6.5)

## 2020-05-08 MED ORDER — MONTELUKAST SODIUM 10 MG PO TABS: ORAL_TABLET | ORAL | 3 refills | Status: DC

## 2020-05-08 MED ORDER — LISINOPRIL 40 MG PO TABS: 40.0000 mg | ORAL_TABLET | Freq: Every day | ORAL | 3 refills | Status: DC

## 2020-05-08 MED ORDER — FLOVENT HFA 110 MCG/ACT IN AERO: 2.0000 | INHALATION_SPRAY | Freq: Two times a day (BID) | RESPIRATORY_TRACT | 6 refills | Status: DC

## 2020-05-08 MED ORDER — ALBUTEROL SULFATE HFA 108 (90 BASE) MCG/ACT IN AERS: 1.0000 | INHALATION_SPRAY | RESPIRATORY_TRACT | 2 refills | Status: DC | PRN

## 2020-05-08 NOTE — Assessment & Plan Note (Signed)
Elevated today, but normal at home per her readings Lisinopril refilled today Reinforced DASH diet and exercise for weight loss CMET today

## 2020-05-08 NOTE — Patient Instructions (Signed)

## 2020-05-08 NOTE — Assessment & Plan Note (Signed)
Refilled Flovent, Albuterol and Singulair Will monitor

## 2020-05-08 NOTE — Assessment & Plan Note (Signed)
Pain resolved s/p THR bilaterally She will continue to follow with ortho

## 2020-05-08 NOTE — Progress Notes (Signed)
Subjective:    Patient ID: Caitlyn Robertson, female    DOB: Jul 08, 1961, 59 y.o.   MRN: [id number]  HPI  Pt presents to the clinic today for her annual exam. She is also due to follow up chronic conditions.  White [place]at HTN: Her BP today is 148/96.  At home, her BP runs 128/70's. She is taking Lisinopril as prescribed.  ECG from 09/2018 reviewed.  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  Improved after hip replacement. She is no longer taking Meloxicam. She continues to follow with ortho.  Allergy Induced Asthma: Worse in the.  Managed on Singulair, Flovent and Albuterol.  There are no PFTs on file.  She does not follow with pulmonology.  Flu: 09/2018 Tetanus: 2019 Shingrix: never Covid: never Pap Smear: partial hysterectomy Mammogram: > 2 years ago Bone Density: never Colon Screening: never Vision Screening: annually Dentist: annually  Diet: She does eat meat. She consumes more veggies than fruits. She occasionally eats fried foods. She drinks mostly coffee, water, Dt. Coke. Exercise: None  Review of Systems  Past Medical History:  Diagnosis Date  . Allergy   . Arthritis   . Asthma   . History of chicken pox   . Hypertension     Current Outpatient Medications  Medication Sig Dispense Refill  . albuterol (VENTOLIN HFA) 108 (90 Base) MCG/ACT inhaler INHALE 1-2 PUFFS INTO THE LUNGS EVERY 4 (FOUR) HOURS AS NEEDED FOR WHEEZING OR SHORTNESS OF BREATH. 18 g 0  . FLOVENT HFA 110 MCG/ACT inhaler INHALE 2 PUFFS INTO THE LUNGS IN THE MORNING AND AT BEDTIME. 36 Inhaler 0  . hydrALAZINE (APRESOLINE) 10 MG tablet TAKE 1 TABLET (10 MG TOTAL) BY MOUTH 3 (THREE) TIMES DAILY. MUST SCHEDULE ANNUAL EXAM 90 tablet 0  . lisinopril (ZESTRIL) 40 MG tablet Take 1 tablet (40 mg total) by mouth daily. 90 tablet 0  . meloxicam (MOBIC) 15 MG tablet TAKE 1 TABLET BY MOUTH EVERY DAY (Patient not taking: Reported on 10/03/2018) 30 tablet 1  . montelukast (SINGULAIR) 10 MG tablet TAKE 1 TABLET BY MOUTH EVERYDAY AT  BEDTIME 90 tablet 0  . NONFORMULARY OR COMPOUNDED ITEM See pharmacy note 120 each 1   No current facility-administered medications for this visit.    Allergies  Allergen Reactions  . Oxycodone     Family History  Problem Relation Age of Onset  . Arthritis Mother   . Colon cancer Mother   . Stroke Mother   . Arthritis Father   . Asthma Father   . Diabetes Father   . Hypertension Father   . Asthma Daughter   . Stroke Maternal Grandmother   . Arthritis Maternal Grandfather   . Depression Maternal Grandfather   . Heart disease Paternal Grandfather   . Arthritis Paternal Grandfather     Social History   Socioeconomic History  . Marital status: Single    Spouse name: Not on file  . Number of children: Not on file  . Years of education: Not on file  . Highest education level: Not on file  Occupational History  . Not on file  Tobacco Use  . Smoking status: Never Smoker  . Smokeless tobacco: Never Used  Substance and Sexual Activity  . Alcohol use: Yes    Comment: rare  . Drug use: No  . Sexual activity: Not on file  Other Topics Concern  . Not on file  Social History Narrative  . Not on file   Social Determinants of Health   Financial  Resource Strain:   . Difficulty of Paying Living Expenses:   Food Insecurity:   . Worried About Charity fundraiser in the Last Year:   . Arboriculturist in the Last Year:   Transportation Needs:   . Film/video editor (Medical):   Marland Kitchen Lack of Transportation (Non-Medical):   Physical Activity:   . Days of Exercise per Week:   . Minutes of Exercise per Session:   Stress:   . Feeling of Stress :   Social Connections:   . Frequency of Communication with Friends and Family:   . Frequency of Social Gatherings with Friends and Family:   . Attends Religious Services:   . Active Member of Clubs or Organizations:   . Attends Archivist Meetings:   Marland Kitchen Marital Status:   Intimate Partner Violence:   . Fear of Current or  Ex-Partner:   . Emotionally Abused:   Marland Kitchen Physically Abused:   . Sexually Abused:      Constitutional: Pt reports weight gain. Denies fever, malaise, fatigue, headache.  HEENT: Denies eye pain, eye redness, ear pain, ringing in the ears, wax buildup, runny nose, nasal congestion, bloody nose, or sore throat. Respiratory: Denies difficulty breathing, shortness of breath, cough or sputum production.   Cardiovascular: Denies chest pain, chest tightness, palpitations or swelling in the hands or feet.  Gastrointestinal: Denies abdominal pain, bloating, constipation, diarrhea or blood in the stool.  GU: Denies urgency, frequency, pain with urination, burning sensation, blood in urine, odor or discharge. Musculoskeletal: Denies decrease in range of motion, difficulty with gait, muscle pain or joint pain or swelling.  Skin: Denies redness, rashes, lesions or ulcercations.  Neurological: Denies dizziness, difficulty with memory, difficulty with speech or problems with balance and coordination.  Psych: Denies anxiety, depression, SI/HI.  No other specific complaints in a complete review of systems (except as listed in HPI above).     Objective:   Physical Exam  BP (!) 148/96   Pulse 76   Temp (!) 97.1 F (36.2 C) (Temporal)   Ht 5' 5.5" (1.664 m)   Wt 237 lb (107.5 kg)   SpO2 98%   BMI 38.84 kg/m   Wt Readings from Last 3 Encounters:  10/03/18 214 lb (97.1 kg)  05/08/18 206 lb (93.4 kg)  03/14/18 214 lb (97.1 kg)    General: Appears her stated age, obese, in NAD. Skin: Warm, dry and intact. No rashes, lesions or ulcerations noted.  HEENT: Head: normal shape and size; Eyes: sclera white, no icterus, conjunctiva pink, PERRLA and EOMs intact;  Neck:  Neck supple, trachea midline. No masses, lumps or thyromegaly present.  Cardiovascular: Normal rate and rhythm. S1,S2 noted.  No murmur, rubs or gallops noted. No JVD or BLE edema. No carotid bruits noted. Pulmonary/Chest: Normal effort and  positive vesicular breath sounds. No respiratory distress. No wheezes, rales or ronchi noted.  Abdomen: Soft and nontender. Normal bowel sounds. No distention or masses noted. Liver, spleen and kidneys non palpable. Musculoskeletal: Strength 5/5 BUE/BLE. No difficulty with gait.  Neurological: Alert and oriented. Cranial nerves II-XII grossly intact. Coordination normal.  Psychiatric: Mood and affect normal. Behavior is normal. Judgment and thought content normal.    BMET    Component Value Date/Time   NA 142 03/14/2018 1606   K 3.6 03/14/2018 1606   CL 107 03/14/2018 1606   CO2 26 03/14/2018 1606   GLUCOSE 97 03/14/2018 1606   BUN 16 03/14/2018 1606   CREATININE 0.82  03/14/2018 1606   CALCIUM 9.5 03/14/2018 1606    Lipid Panel     Component Value Date/Time   CHOL 176 10/10/2017 0847   TRIG 47.0 10/10/2017 0847   HDL 67.70 10/10/2017 0847   CHOLHDL 3 10/10/2017 0847   VLDL 9.4 10/10/2017 0847   LDLCALC 99 10/10/2017 0847    CBC    Component Value Date/Time   WBC 9.1 03/14/2018 1606   RBC 4.93 03/14/2018 1606   HGB 14.6 03/14/2018 1606   HCT 42.9 03/14/2018 1606   PLT 233.0 03/14/2018 1606   MCV 87.1 03/14/2018 1606   MCHC 34.0 03/14/2018 1606   RDW 14.9 03/14/2018 1606    Hgb A1C Lab Results  Component Value Date   HGBA1C 5.8 03/14/2018            Assessment & Plan:   Preventative Health Maintenance:  Encouraged her to get a flu shot in the fall Tetanus UTD She will consider Covid vaccine She will consider Shingrix vaccine She no longer needs pap smears Mammogram ordered, she will call to schedule Bone density ordered, she will call to schedule Referral to GI for screening colonoscopy Encouraged him to consume a balanced diet and exercise regimen Advised her to see an eye doctor and dentist annually Will check CBC, CMET, Lipid, A1C and Vit D today  RTC in 1 year, sooner if needed Webb Silversmith, NP This visit occurred during the SARS-CoV-2  public health emergency.  Safety protocols were in place, including screening questions prior to the visit, additional usage of staff PPE, and extensive cleaning of exam room while observing appropriate contact time as indicated for disinfecting solutions.

## 2020-05-09 ENCOUNTER — Encounter: Payer: Self-pay | Admitting: Internal Medicine

## 2020-05-22 ENCOUNTER — Other Ambulatory Visit: Payer: Self-pay

## 2020-05-22 ENCOUNTER — Telehealth (INDEPENDENT_AMBULATORY_CARE_PROVIDER_SITE_OTHER): Payer: Self-pay | Admitting: Gastroenterology

## 2020-05-22 DIAGNOSIS — Z1211 Encounter for screening for malignant neoplasm of colon: Secondary | ICD-10-CM

## 2020-05-22 NOTE — Progress Notes (Signed)
Gastroenterology Pre-Procedure Review  Request Date: Friday 08/22/20 Requesting Physician: Dr. Vicente Males  PATIENT REVIEW QUESTIONS: The patient responded to the following health history questions as indicated:    1. Are you having any GI issues? no 2. Do you have a personal history of Polyps? no 3. Do you have a family history of Colon Cancer or Polyps? yes (mother colon cancer) 4. Diabetes Mellitus? no 5. Joint replacements in the past 12 months?no 6. Major health problems in the past 3 months?no 7. Any artificial heart valves, MVP, or defibrillator?no    MEDICATIONS & ALLERGIES:    Patient reports the following regarding taking any anticoagulation/antiplatelet therapy:   Plavix, Coumadin, Eliquis, Xarelto, Lovenox, Pradaxa, Brilinta, or Effient? no Aspirin? no  Patient confirms/reports the following medications:  Current Outpatient Medications  Medication Sig Dispense Refill  . acetaminophen (TYLENOL) 500 MG tablet Take 500 mg by mouth as needed.    Marland Kitchen albuterol (VENTOLIN HFA) 108 (90 Base) MCG/ACT inhaler Inhale 1-2 puffs into the lungs every 4 (four) hours as needed for wheezing or shortness of breath. 18 g 2  . fluticasone (FLOVENT HFA) 110 MCG/ACT inhaler Inhale 2 puffs into the lungs in the morning and at bedtime. 1 Inhaler 6  . lisinopril (ZESTRIL) 40 MG tablet Take 1 tablet (40 mg total) by mouth daily. 90 tablet 3  . montelukast (SINGULAIR) 10 MG tablet TAKE 1 TABLET BY MOUTH EVERYDAY AT BEDTIME 90 tablet 3   No current facility-administered medications for this visit.    Patient confirms/reports the following allergies:  Allergies  Allergen Reactions  . Oxycodone     No orders of the defined types were placed in this encounter.   AUTHORIZATION INFORMATION Primary Insurance: 1D#: Group #:  Secondary Insurance: 1D#: Group #:  SCHEDULE INFORMATION: Date: Friday 08/22/20 Time: Location:[facility]

## 2020-07-11 ENCOUNTER — Encounter: Payer: Self-pay | Admitting: Internal Medicine

## 2020-07-28 ENCOUNTER — Encounter: Payer: Self-pay | Admitting: Internal Medicine

## 2020-07-29 MED ORDER — ALBUTEROL SULFATE HFA 108 (90 BASE) MCG/ACT IN AERS: 1.0000 | INHALATION_SPRAY | RESPIRATORY_TRACT | 2 refills | Status: DC | PRN

## 2020-08-18 ENCOUNTER — Telehealth: Payer: Self-pay | Admitting: Gastroenterology

## 2020-08-18 NOTE — Telephone Encounter (Signed)
Patient needs to cancel her procedure for this Friday 10.1.21 with Dr. Vicente Males due to her boss having covid and her having to quarantine. Patient states she will cb to resch at a later time. Please cancel procedure for now.

## 2020-08-22 ENCOUNTER — Ambulatory Visit
Admission: RE | Admit: 2020-08-22 | Payer: BC Managed Care – PPO | Source: Ambulatory Visit | Admitting: Gastroenterology

## 2020-08-22 ENCOUNTER — Encounter: Admission: RE | Payer: Self-pay | Source: Ambulatory Visit

## 2020-08-22 SURGERY — COLONOSCOPY WITH PROPOFOL
Anesthesia: General

## 2021-01-14 ENCOUNTER — Other Ambulatory Visit: Payer: Self-pay | Admitting: Internal Medicine

## 2021-02-25 ENCOUNTER — Encounter: Payer: Self-pay | Admitting: Internal Medicine

## 2021-02-25 MED ORDER — ALBUTEROL SULFATE HFA 108 (90 BASE) MCG/ACT IN AERS: 1.0000 | INHALATION_SPRAY | RESPIRATORY_TRACT | 0 refills | Status: DC | PRN

## 2021-07-03 ENCOUNTER — Other Ambulatory Visit: Payer: Self-pay

## 2021-07-03 ENCOUNTER — Encounter: Payer: Self-pay | Admitting: Internal Medicine

## 2021-07-03 DIAGNOSIS — I10 Essential (primary) hypertension: Secondary | ICD-10-CM

## 2021-07-03 DIAGNOSIS — J4530 Mild persistent asthma, uncomplicated: Secondary | ICD-10-CM

## 2021-07-03 MED ORDER — LISINOPRIL 40 MG PO TABS: 40.0000 mg | ORAL_TABLET | Freq: Every day | ORAL | 3 refills | Status: DC

## 2021-07-03 MED ORDER — FLUTICASONE PROPIONATE HFA 110 MCG/ACT IN AERO: 2.0000 | INHALATION_SPRAY | Freq: Two times a day (BID) | RESPIRATORY_TRACT | 1 refills | Status: DC

## 2021-07-03 MED ORDER — MONTELUKAST SODIUM 10 MG PO TABS: ORAL_TABLET | ORAL | 3 refills | Status: DC

## 2021-07-03 MED ORDER — ALBUTEROL SULFATE HFA 108 (90 BASE) MCG/ACT IN AERS: 1.0000 | INHALATION_SPRAY | RESPIRATORY_TRACT | 0 refills | Status: DC | PRN

## 2021-08-31 ENCOUNTER — Encounter: Payer: Self-pay | Admitting: Internal Medicine

## 2021-08-31 DIAGNOSIS — J4530 Mild persistent asthma, uncomplicated: Secondary | ICD-10-CM

## 2021-09-01 MED ORDER — ALBUTEROL SULFATE HFA 108 (90 BASE) MCG/ACT IN AERS: 1.0000 | INHALATION_SPRAY | RESPIRATORY_TRACT | 2 refills | Status: DC | PRN

## 2021-11-24 ENCOUNTER — Encounter: Payer: Self-pay | Admitting: Internal Medicine

## 2022-01-24 ENCOUNTER — Other Ambulatory Visit: Payer: Self-pay | Admitting: Internal Medicine

## 2022-01-24 DIAGNOSIS — J4530 Mild persistent asthma, uncomplicated: Secondary | ICD-10-CM

## 2022-01-25 ENCOUNTER — Encounter: Payer: Self-pay | Admitting: Internal Medicine

## 2022-01-25 MED ORDER — FLUTICASONE-SALMETEROL 100-50 MCG/ACT IN AEPB: 1.0000 | INHALATION_SPRAY | Freq: Two times a day (BID) | RESPIRATORY_TRACT | 0 refills | Status: DC

## 2022-01-26 NOTE — Telephone Encounter (Signed)
Requested medication (s) are due for refill today:   No  Discontinued 01/25/2022 ? ?Requested medication (s) are on the active medication list:   No  Flovent was ordered instead per MyChart message ? ?Future visit scheduled:   No ? ? ?Last ordered: Returned because not on med list.   Also a non delegated refill    ? ?Requested Prescriptions  ?Pending Prescriptions Disp Refills  ? FLOVENT HFA 110 MCG/ACT inhaler [Pharmacy Med Name: [place] HFA 110 MCG INHALER] 12 each 5  ?  Sig: INHALE 2 PUFFS INTO THE LUNGS IN THE MORNING AND AT BEDTIME.  ?  ? Not Delegated - Pulmonology:  Corticosteroids 2 Failed - 01/24/2022 11:05 AM  ?  ?  Failed - This refill cannot be delegated  ?  ?  Failed - Valid encounter within last 12 months  ?  Recent Outpatient Visits   ?None ?  ?  ? ?  ?  ?  ? ?

## 2022-02-09 ENCOUNTER — Ambulatory Visit: Payer: Self-pay | Admitting: *Deleted

## 2022-02-09 ENCOUNTER — Telehealth: Payer: BC Managed Care – PPO | Admitting: Physician Assistant

## 2022-02-09 DIAGNOSIS — J4531 Mild persistent asthma with (acute) exacerbation: Secondary | ICD-10-CM | POA: Diagnosis not present

## 2022-02-09 DIAGNOSIS — J309 Allergic rhinitis, unspecified: Secondary | ICD-10-CM | POA: Diagnosis not present

## 2022-02-09 DIAGNOSIS — J4530 Mild persistent asthma, uncomplicated: Secondary | ICD-10-CM | POA: Diagnosis not present

## 2022-02-09 MED ORDER — BENZONATATE 100 MG PO CAPS: 100.0000 mg | ORAL_CAPSULE | Freq: Three times a day (TID) | ORAL | 0 refills | Status: DC | PRN

## 2022-02-09 MED ORDER — LEVOCETIRIZINE DIHYDROCHLORIDE 5 MG PO TABS: 5.0000 mg | ORAL_TABLET | Freq: Every evening | ORAL | 1 refills | Status: DC

## 2022-02-09 MED ORDER — ALBUTEROL SULFATE HFA 108 (90 BASE) MCG/ACT IN AERS: 1.0000 | INHALATION_SPRAY | RESPIRATORY_TRACT | 2 refills | Status: DC | PRN

## 2022-02-09 MED ORDER — PREDNISONE 20 MG PO TABS
40.0000 mg | ORAL_TABLET | Freq: Every day | ORAL | 0 refills | Status: AC
Start: 2022-02-09 — End: 2022-02-14

## 2022-02-09 NOTE — Patient Instructions (Addendum)
?[name], thank you for joining Leeanne Rio, PA-C for today's virtual visit.  While this provider is not your primary care provider (PCP), if your PCP is located in our provider database this encounter information will be shared with them immediately following your visit. ? ?Consent: ?(Patient) Caitlyn Robertson provided verbal consent for this virtual visit at the beginning of the encounter. ? ?Current Medications: ? ?Current Outpatient Medications:  ?  acetaminophen (TYLENOL) 500 MG tablet, Take 500 mg by mouth as needed., Disp: , Rfl:  ?  albuterol (VENTOLIN HFA) 108 (90 Base) MCG/ACT inhaler, Inhale 1-2 puffs into the lungs every 4 (four) hours as needed for wheezing or shortness of breath., Disp: 24 g, Rfl: 2 ?  benzonatate (TESSALON) 100 MG capsule, Take 1 capsule (100 mg total) by mouth 3 (three) times daily as needed for cough., Disp: 30 capsule, Rfl: 0 ?  fluticasone-salmeterol (ADVAIR) 100-50 MCG/ACT AEPB, Inhale 1 puff into the lungs 2 (two) times daily., Disp: 1 each, Rfl: 0 ?  levocetirizine (XYZAL) 5 MG tablet, Take 1 tablet (5 mg total) by mouth every evening., Disp: 30 tablet, Rfl: 1 ?  lisinopril (ZESTRIL) 40 MG tablet, Take 1 tablet (40 mg total) by mouth daily., Disp: 90 tablet, Rfl: 3 ?  montelukast (SINGULAIR) 10 MG tablet, TAKE 1 TABLET BY MOUTH EVERYDAY AT BEDTIME, Disp: 90 tablet, Rfl: 3 ?  predniSONE (DELTASONE) 20 MG tablet, Take 2 tablets (40 mg total) by mouth daily with breakfast for 5 days., Disp: 10 tablet, Rfl: 0  ? ?Medications ordered in this encounter:  ?Meds ordered this encounter  ?Medications  ? levocetirizine (XYZAL) 5 MG tablet  ?  Sig: Take 1 tablet (5 mg total) by mouth every evening.  ?  Dispense:  30 tablet  ?  Refill:  1  ?  Order Specific Question:   Supervising Provider  ?  Answer:   Noemi Chapel [[id number]]  ? predniSONE (DELTASONE) 20 MG tablet  ?  Sig: Take 2 tablets (40 mg total) by mouth daily with breakfast for 5 days.  ?  Dispense:  10 tablet  ?   Refill:  0  ?  Order Specific Question:   Supervising Provider  ?  Answer:   Noemi Chapel [[id number]]  ? benzonatate (TESSALON) 100 MG capsule  ?  Sig: Take 1 capsule (100 mg total) by mouth 3 (three) times daily as needed for cough.  ?  Dispense:  30 capsule  ?  Refill:  0  ?  Order Specific Question:   Supervising Provider  ?  Answer:   Noemi Chapel [[id number]]  ?  ? ?*If you need refills on other medications prior to your next appointment, please contact your pharmacy* ? ?Follow-Up: ?Call back or seek an in-person evaluation if the symptoms worsen or if the condition fails to improve as anticipated. ? ?Other Instructions ?Please keep well-hydrated and get plenty of rest.  ?Start the Xyzal once daily.  ?Start the [facility] for cough. ?Continue your breathing medications and take the steroid burst as directed.  ? ? ?If you have been instructed to have an in-person evaluation today at a local Urgent Care facility, please use the link below. It will take you to a list of all of our available [facility], including address, phone number and hours of operation. Please do not delay care.  ?[facility] ? ?If you or a family member do not have a primary care provider, use the link  below to schedule a visit and establish care. When you choose a [facility] primary care physician or advanced practice provider, you gain a long-term partner in health. ?Find a Primary Care Provider ? ?Learn more about [facility] in-office and virtual care options: ?[facility]  ? ?

## 2022-02-09 NOTE — Progress Notes (Signed)
?Virtual Visit Consent  ? ?Caitlyn Robertson, you are scheduled for a virtual visit with a [facility] provider today.   ?  ?Just as with appointments in the office, your consent must be obtained to participate.  Your consent will be active for this visit and any virtual visit you may have with one of our providers in the next 365 days.   ?  ?If you have a MyChart account, a copy of this consent can be sent to you electronically.  All virtual visits are billed to your insurance company just like a traditional visit in the office.   ? ?As this is a virtual visit, video technology does not allow for your provider to perform a traditional examination.  This may limit your provider's ability to fully assess your condition.  If your provider identifies any concerns that need to be evaluated in person or the need to arrange testing (such as labs, EKG, etc.), we will make arrangements to do so.   ?  ?Although advances in technology are sophisticated, we cannot ensure that it will always work on either your end or our end.  If the connection with a video visit is poor, the visit may have to be switched to a telephone visit.  With either a video or telephone visit, we are not always able to ensure that we have a secure connection.    ? ?I need to obtain your verbal consent now.   Are you willing to proceed with your visit today?  ?  ?Caitlyn Robertson has provided verbal consent on 02/09/2022 for a virtual visit (video or telephone). ?  ?Leeanne Rio, PA-C  ? ?Date: 02/09/2022 10:16 AM ? ? ?Virtual Visit via Video Note  ? ?ILeeanne Rio, connected with  Caitlyn Robertson  ([id number], 1962-07-62) on 02/09/22 at 10:00 AM EDT by a video-enabled telemedicine application and verified that I am speaking with the correct person using two identifiers. ? ?Location: ?Patient: Virtual Visit Location Patient: Home ?Provider: Virtual Visit Location Provider: Home Office ?  ?I discussed the limitations of evaluation and  management by telemedicine and the availability of in person appointments. The patient expressed understanding and agreed to proceed.   ? ?History of Present Illness: ?Caitlyn Robertson is a 61 y.o. who identifies as a female who was assigned female at birth, and is being seen today for a few weeks of significant nasal and chest congestion with consistent mucous production. Is mainly clear but very thick. Sometimes slightly yellow. Notes symptoms starting after flowers started blooming. Notes coughing spells secondary to mucous production. Does note some wheezing at times, especially after coughing spells.  Denies SOB at rest. Has been using her Advair and albuterol.  ? ?HPI: HPI  ?Problems:  ?Patient Active Problem List  ? Diagnosis Date Noted  ? 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 White coat syndrome with hypertension 07/18/2017  ? Primary osteoarthritis of both hips 07/18/2017  ?  ?Allergies:  ?Allergies  ?Allergen Reactions  ? Oxycodone   ? ?Medications:  ?Current Outpatient Medications:  ?  acetaminophen (TYLENOL) 500 MG tablet, Take 500 mg by mouth as needed., Disp: , Rfl:  ?  albuterol (VENTOLIN HFA) 108 (90 Base) MCG/ACT inhaler, Inhale 1-2 puffs into the lungs every 4 (four) hours as needed for wheezing or shortness of breath., Disp: 24 g, Rfl: 2 ?  benzonatate (TESSALON) 100 MG capsule, Take 1 capsule (100 mg total) by mouth 3 (three) times daily as needed for  cough., Disp: 30 capsule, Rfl: 0 ?  fluticasone-salmeterol (ADVAIR) 100-50 MCG/ACT AEPB, Inhale 1 puff into the lungs 2 (two) times daily., Disp: 1 each, Rfl: 0 ?  levocetirizine (XYZAL) 5 MG tablet, Take 1 tablet (5 mg total) by mouth every evening., Disp: 30 tablet, Rfl: 1 ?  lisinopril (ZESTRIL) 40 MG tablet, Take 1 tablet (40 mg total) by mouth daily., Disp: 90 tablet, Rfl: 3 ?  montelukast (SINGULAIR) 10 MG tablet, TAKE 1 TABLET BY MOUTH EVERYDAY AT BEDTIME, Disp: 90 tablet, Rfl: 3 ?  predniSONE (DELTASONE) 20 MG tablet, Take 2 tablets (40 mg  total) by mouth daily with breakfast for 5 days., Disp: 10 tablet, Rfl: 0 ? ?Observations/Objective: ?Patient is well-developed, well-nourished in no acute distress.  ?No labored breathing. ?Speech is clear and coherent with logical content.  ?Patient is alert and oriented at baseline.  ? ?Assessment and Plan: ?1. Chronic allergic rhinitis ?- levocetirizine (XYZAL) 5 MG tablet; Take 1 tablet (5 mg total) by mouth every evening.  Dispense: 30 tablet; Refill: 1 ?- predniSONE (DELTASONE) 20 MG tablet; Take 2 tablets (40 mg total) by mouth daily with breakfast for 5 days.  Dispense: 10 tablet; Refill: 0 ?- benzonatate (TESSALON) 100 MG capsule; Take 1 capsule (100 mg total) by mouth 3 (three) times daily as needed for cough.  Dispense: 30 capsule; Refill: 0 ? ?2. Mild persistent asthma with exacerbation ?- predniSONE (DELTASONE) 20 MG tablet; Take 2 tablets (40 mg total) by mouth daily with breakfast for 5 days.  Dispense: 10 tablet; Refill: 0 ?- benzonatate (TESSALON) 100 MG capsule; Take 1 capsule (100 mg total) by mouth 3 (three) times daily as needed for cough.  Dispense: 30 capsule; Refill: 0 ? ? ?Seems inflammatory/allergy-related over infectious as has been present for some time and non-progressing. Will switch her to Xyzal once daily. Start burst of steroid for 5 days for asthma flare. Tessalon per orders. Will refill her Albuterol.  ? ?Follow Up Instructions: ?I discussed the assessment and treatment plan with the patient. The patient was provided an opportunity to ask questions and all were answered. The patient agreed with the plan and demonstrated an understanding of the instructions.  A copy of instructions were sent to the patient via MyChart unless otherwise noted below.  ? ?The patient was advised to call back or seek an in-person evaluation if the symptoms worsen or if the condition fails to improve as anticipated. ? ?Time:  ?I spent 10 minutes with the patient via telehealth technology discussing the  above problems/concerns.   ? ?Leeanne Rio, PA-C ?

## 2022-02-09 NOTE — Telephone Encounter (Signed)
? ? ? ?  Chief Complaint: Cough ?Symptoms: Productive cough, "Little yellowish" wheezing at times, mild SOB with exertion ?Frequency: Several weeks, worsening ?Pertinent Negatives: Patient denies fever ?Disposition: '[]'$ ED /'[]'$ Urgent Care (no appt availability in office) / '[]'$ Appointment(In office/virtual)/ '[x]'$  [facility]/ '[]'$ Home Care/ '[]'$ Refused Recommended Disposition /'[]'$ [facility]/ '[]'$  Follow-up with PCP ?Additional Notes:  Virtual Appt secured for pt. To be seen within 4 hours per protocol. ? ?Reason for Disposition ? Wheezing is present ? ?Answer Assessment - Initial Assessment Questions ?1. ONSET: "When did the cough begin?"  ?    2 weeks ago ?2. SEVERITY: "How bad is the cough today?"  ?    Awake at night ?3. SPUTUM: "Describe the color of your sputum" (none, dry cough; clear, white, yellow, green) ?    Little yellowish ?4. HEMOPTYSIS: "Are you coughing up any blood?" If so ask: "How much?" (flecks, streaks, tablespoons, etc.) ?    no ?5. DIFFICULTY BREATHING: "Are you having difficulty breathing?" If Yes, ask: "How bad is it?" (e.g., mild, moderate, severe)  ?  - MILD: No SOB at rest, mild SOB with walking, speaks normally in sentences, can lie down, no retractions, pulse < 100.  ?  - MODERATE: SOB at rest, SOB with minimal exertion and prefers to sit, cannot lie down flat, speaks in phrases, mild retractions, audible wheezing, pulse 100-120.  ?  - SEVERE: Very SOB at rest, speaks in single words, struggling to breathe, sitting hunched forward, retractions, pulse > 120  ?    With coughing and exertion ?6. FEVER: "Do you have a fever?" If Yes, ask: "What is your temperature, how was it measured, and when did it start?" ?    no ?7. CARDIAC HISTORY: "Do you have any history of heart disease?" (e.g., heart attack, congestive heart failure)  ?    HTN ?8. LUNG HISTORY: "Do you have any history of lung disease?"  (e.g., pulmonary embolus, asthma, emphysema) ?    Asthma ?9. PE RISK  FACTORS: "Do you have a history of blood clots?" (or: recent major surgery, recent prolonged travel, bedridden) ?     ?10. OTHER SYMPTOMS: "Do you have any other symptoms?" (e.g., runny nose, wheezing, chest pain) ?      Inhalers helping some, wheezing, comes and goes ? ?Protocols used: Cough - Acute Productive-A-AH ? ?

## 2022-02-12 ENCOUNTER — Ambulatory Visit: Payer: Self-pay

## 2022-02-12 ENCOUNTER — Telehealth: Payer: BC Managed Care – PPO | Admitting: Family Medicine

## 2022-02-12 DIAGNOSIS — B9689 Other specified bacterial agents as the cause of diseases classified elsewhere: Secondary | ICD-10-CM

## 2022-02-12 DIAGNOSIS — Z8709 Personal history of other diseases of the respiratory system: Secondary | ICD-10-CM

## 2022-02-12 DIAGNOSIS — J019 Acute sinusitis, unspecified: Secondary | ICD-10-CM | POA: Diagnosis not present

## 2022-02-12 MED ORDER — AMOXICILLIN-POT CLAVULANATE 875-125 MG PO TABS: 1.0000 | ORAL_TABLET | Freq: Two times a day (BID) | ORAL | 0 refills | Status: DC

## 2022-02-12 NOTE — Patient Instructions (Signed)
Acute Bronchitis, Adult ?Acute bronchitis is when air tubes in the lungs (bronchi) suddenly get swollen. The condition can make it hard for you to breathe. In adults, acute bronchitis usually goes away within 2 weeks. A cough caused by bronchitis may last up to 3 weeks. Smoking, allergies, and asthma can make the condition worse. ?What are the causes? ?Germs that cause cold and flu (viruses). The most common cause of this condition is the virus that causes the common cold. ?Bacteria. ?Substances that bother (irritate) the lungs, including: ?Smoke from cigarettes and other types of tobacco. ?Dust and pollen. ?Fumes from chemicals, gases, or burned fuel. ?Indoor or outdoor air pollution. ?What increases the risk? ?A weak body's defense system. This is also called the immune system. ?Any condition that affects your lungs and breathing, such as asthma. ?What are the signs or symptoms? ?A cough. ?Coughing up clear, yellow, or green mucus. ?Making high-pitched whistling sounds when you breathe, most often when you breathe out (wheezing). ?Runny or stuffy nose. ?Having too much mucus in your lungs (chest congestion). ?Shortness of breath. ?Body aches. ?A sore throat. ?How is this treated? ?Acute bronchitis may go away over time without treatment. Your doctor may tell you to: ?Drink more fluids. This will help thin your mucus so it is easier to cough up. ?Use a device that gets medicine into your lungs (inhaler). ?Use a vaporizer or a humidifier. These are machines that add water to the air. This helps with coughing and poor breathing. ?Take a medicine that thins mucus and helps clear it from your lungs. ?Take a medicine that prevents or stops coughing. ?It is not common to take an antibiotic medicine for this condition. ?Follow these instructions at home: ? ?Take over-the-counter and prescription medicines only as told by your doctor. ?Use an inhaler, vaporizer, or humidifier as told by your doctor. ?Take two teaspoons (10  mL) of honey at bedtime. This helps lessen your coughing at night. ?Drink enough fluid to keep your pee (urine) pale yellow. ?Do not smoke or use any products that contain nicotine or tobacco. If you need help quitting, ask your doctor. ?Get a lot of rest. ?Return to your normal activities when your doctor says that it is safe. ?Keep all follow-up visits. ?How is this prevented? ? ?Wash your hands often with soap and water for at least 20 seconds. If you cannot use soap and water, use hand sanitizer. ?Avoid contact with people who have cold symptoms. ?Try not to touch your mouth, nose, or eyes with your hands. ?Avoid breathing in smoke or chemical fumes. ?Make sure to get the flu shot every year. ?Contact a doctor if: ?Your symptoms do not get better in 2 weeks. ?You have trouble coughing up the mucus. ?Your cough keeps you awake at night. ?You have a fever. ?Get help right away if: ?You cough up blood. ?You have chest pain. ?You have very bad shortness of breath. ?You faint or keep feeling like you are going to faint. ?You have a very bad headache. ?Your fever or chills get worse. ?These symptoms may be an emergency. Get help right away. Call your local emergency services (911 in the [place] ?Do not wait to see if the symptoms will go away. ?Do not drive yourself to the hospital. ?Summary ?Acute bronchitis is when air tubes in the lungs (bronchi) suddenly get swollen. In adults, acute bronchitis usually goes away within 2 weeks. ?Drink more fluids. This will help thin your mucus so it is easier to   cough up. ?Take over-the-counter and prescription medicines only as told by your doctor. ?Contact a doctor if your symptoms do not improve after 2 weeks of treatment. ?This information is not intended to replace advice given to you by your health care provider. Make sure you discuss any questions you have with your health care provider. ?Document Revised: 03/11/2021 Document Reviewed: 03/11/2021 ?Elsevier Patient Education  ? [place]. ?Sinusitis, Adult ?Sinusitis is inflammation of your sinuses. Sinuses are hollow spaces in the bones around your face. Your sinuses are located: ?Around your eyes. ?In the middle of your forehead. ?Behind your nose. ?In your cheekbones. ?Mucus normally drains out of your sinuses. When your nasal tissues become inflamed or swollen, mucus can become trapped or blocked. This allows bacteria, viruses, and fungi to grow, which leads to infection. Most infections of the sinuses are caused by a virus. ?Sinusitis can develop quickly. It can last for up to 4 weeks (acute) or for more than 12 weeks (chronic). Sinusitis often develops after a cold. ?What are the causes? ?This condition is caused by anything that creates swelling in the sinuses or stops mucus from draining. This includes: ?Allergies. ?Asthma. ?Infection from bacteria or viruses. ?Deformities or blockages in your nose or sinuses. ?Abnormal growths in the nose (nasal polyps). ?Pollutants, such as chemicals or irritants in the air. ?Infection from fungi (rare). ?What increases the risk? ?You are more likely to develop this condition if you: ?Have a weak body defense system (immune system). ?Do a lot of swimming or diving. ?Overuse nasal sprays. ?Smoke. ?What are the signs or symptoms? ?The main symptoms of this condition are pain and a feeling of pressure around the affected sinuses. Other symptoms include: ?Stuffy nose or congestion. ?Thick drainage from your nose. ?Swelling and warmth over the affected sinuses. ?Headache. ?Upper toothache. ?A cough that may get worse at night. ?Extra mucus that collects in the throat or the back of the nose (postnasal drip). ?Decreased sense of smell and taste. ?Fatigue. ?A fever. ?Sore throat. ?Bad breath. ?How is this diagnosed? ?This condition is diagnosed based on: ?Your symptoms. ?Your medical history. ?A physical exam. ?Tests to find out if your condition is acute or chronic. This may  include: ?Checking your nose for nasal polyps. ?Viewing your sinuses using a device that has a light (endoscope). ?Testing for allergies or bacteria. ?Imaging tests, such as an MRI or CT scan. ?In rare cases, a bone biopsy may be done to rule out more serious types of fungal sinus disease. ?How is this treated? ?Treatment for sinusitis depends on the cause and whether your condition is chronic or acute. ?If caused by a virus, your symptoms should go away on their own within 10 days. You may be given medicines to relieve symptoms. They include: ?Medicines that shrink swollen nasal passages (topical intranasal decongestants). ?Medicines that treat allergies (antihistamines). ?A spray that eases inflammation of the nostrils (topical intranasal corticosteroids). ?Rinses that help get rid of thick mucus in your nose (nasal saline washes). ?If caused by bacteria, your health care provider may recommend waiting to see if your symptoms improve. Most bacterial infections will get better without antibiotic medicine. You may be given antibiotics if you have: ?A severe infection. ?A weak immune system. ?If caused by narrow nasal passages or nasal polyps, you may need to have surgery. ?Follow these instructions at home: ?Medicines ?Take, use, or apply over-the-counter and prescription medicines only as told by your health care provider. These may include nasal sprays. ?If you  were prescribed an antibiotic medicine, take it as told by your health care provider. Do not stop taking the antibiotic even if you start to feel better. ?Hydrate and humidify ? ?Drink enough fluid to keep your urine pale yellow. Staying hydrated will help to thin your mucus. ?Use a cool mist humidifier to keep the humidity level in your home above 50%. ?Inhale steam for 10-15 minutes, 3-4 times a day, or as told by your health care provider. You can do this in the bathroom while a hot shower is running. ?Limit your exposure to cool or dry air. ?Rest ?Rest  as much as possible. ?Sleep with your head raised (elevated). ?Make sure you get enough sleep each night. ?General instructions ? ?Apply a warm, moist washcloth to your face 3-4 times a day or as told by your health

## 2022-02-12 NOTE — Telephone Encounter (Signed)
?  Chief Complaint: Fever ?Symptoms: Fever ?Frequency: yesterday ?Pertinent Negatives: Patient denies Chills, SOB ?Disposition: '[]'$ ED /'[]'$ Urgent Care (no appt availability in office) / '[x]'$ Appointment(In office/virtual)/ '[]'$  [facility]/ '[]'$ Home Care/ '[]'$ Refused Recommended Disposition /'[]'$ [facility]/ '[]'$  Follow-up with PCP ?Additional Notes: Pt was seen at virtual appointment, and now has a fever. Pt would like antibiotics for fever/infection.  ? ? ?Answer Assessment - Initial Assessment Questions ?1. TEMPERATURE: "What is the most recent temperature?"  "How was it measured?" 100.4 ?     ?2. ONSET: "When did the fever start?"  ?    yesterday ?3. CHILLS: "Do you have chills?" If yes: "How bad are they?"  (e.g., none, mild, moderate, severe) ?  - NONE: no chills ?  - MILD: feeling cold ?  - MODERATE: feeling very cold, some shivering (feels better under a thick blanket) ?  - SEVERE: feeling extremely cold with shaking chills (general body shaking, rigors; even under a thick blanket)  ?    none ?4. OTHER SYMPTOMS: "Do you have any other symptoms besides the fever?"  (e.g., abdomen pain, cough, diarrhea, earache, headache, sore throat, urination pain) ?    None ?5. CAUSE: If there are no symptoms, ask: "What do you think is causing the fever?"  ?    Infection ?6. CONTACTS: "Does anyone else in the family have an infection?" ?    no ?7. TREATMENT: "What have you done so far to treat this fever?" (e.g., medications) ?    na ?8. IMMUNOCOMPROMISE: "Do you have of the following: diabetes, HIV positive, splenectomy, cancer chemotherapy, chronic steroid treatment, transplant patient, etc." ?    na ?9. PREGNANCY: "Is there any chance you are pregnant?" "When was your last menstrual period?" ?    na ?10. TRAVEL: "Have you traveled out of the country in the last month?" (e.g., travel history, exposures) ?      na ? ?Protocols used: Fever-A-AH ? ?

## 2022-02-12 NOTE — Progress Notes (Signed)
?Virtual Visit Consent  ? ?Caitlyn Robertson, you are scheduled for a virtual visit with a [facility] provider today.   ?  ?Just as with appointments in the office, your consent must be obtained to participate.  Your consent will be active for this visit and any virtual visit you may have with one of our providers in the next 365 days.   ?  ?If you have a MyChart account, a copy of this consent can be sent to you electronically.  All virtual visits are billed to your insurance company just like a traditional visit in the office.   ? ?As this is a virtual visit, video technology does not allow for your provider to perform a traditional examination.  This may limit your provider's ability to fully assess your condition.  If your provider identifies any concerns that need to be evaluated in person or the need to arrange testing (such as labs, EKG, etc.), we will make arrangements to do so.   ?  ?Although advances in technology are sophisticated, we cannot ensure that it will always work on either your end or our end.  If the connection with a video visit is poor, the visit may have to be switched to a telephone visit.  With either a video or telephone visit, we are not always able to ensure that we have a secure connection.    ? ?I need to obtain your verbal consent now.   Are you willing to proceed with your visit today?  ?  ?Caitlyn Robertson has provided verbal consent on 02/12/2022 for a virtual visit (video or telephone). ?  ?Caitlyn Nims, FNP  ? ?Date: 02/12/2022 5:08 PM ? ? ?Virtual Visit via Video Note  ? ?IDellia Robertson, connected with  Caitlyn Robertson  ([id number], 1961/09/28) on 02/12/22 at  5:00 PM EDT by a video-enabled telemedicine application and verified that I am speaking with the correct person using two identifiers. ? ?Location: ?Patient: Home ?Provider: Virtual Visit Location Provider: Office/Clinic ?  ?I discussed the limitations of evaluation and management by telemedicine and the availability  of in person appointments. The patient expressed understanding and agreed to proceed.   ? ?History of Present Illness: ?Caitlyn Robertson is a 61 y.o. who identifies as a female who was assigned female at birth, and is being seen today for worsening sinus pain and cough. She was seen Monday and given prednisone. This ends tomorrow. She says then her significant other came home with uri and fever. She has started with a fever. Neg covid. History of asthma. Mucus is yellow green now and she feels she needs an antibiotic. . ? ?HPI: HPI  ?Problems:  ?  ?Allergies:  ?Allergies  ?Allergen Reactions  ?? Oxycodone   ? ?Medications:  ?Current Outpatient Medications:  ??  acetaminophen (TYLENOL) 500 MG tablet, Take 500 mg by mouth as needed., Disp: , Rfl:  ??  albuterol (VENTOLIN HFA) 108 (90 Base) MCG/ACT inhaler, Inhale 1-2 puffs into the lungs every 4 (four) hours as needed for wheezing or shortness of breath., Disp: 24 g, Rfl: 2 ??  benzonatate (TESSALON) 100 MG capsule, Take 1 capsule (100 mg total) by mouth 3 (three) times daily as needed for cough., Disp: 30 capsule, Rfl: 0 ??  fluticasone-salmeterol (ADVAIR) 100-50 MCG/ACT AEPB, Inhale 1 puff into the lungs 2 (two) times daily., Disp: 1 each, Rfl: 0 ??  levocetirizine (XYZAL) 5 MG tablet, Take 1 tablet (5 mg total) by mouth every evening.,  Disp: 30 tablet, Rfl: 1 ??  lisinopril (ZESTRIL) 40 MG tablet, Take 1 tablet (40 mg total) by mouth daily., Disp: 90 tablet, Rfl: 3 ??  montelukast (SINGULAIR) 10 MG tablet, TAKE 1 TABLET BY MOUTH EVERYDAY AT BEDTIME, Disp: 90 tablet, Rfl: 3 ??  predniSONE (DELTASONE) 20 MG tablet, Take 2 tablets (40 mg total) by mouth daily with breakfast for 5 days., Disp: 10 tablet, Rfl: 0 ? ?Observations/Objective: ?Patient is well-developed, well-nourished in no acute distress.  ?Resting comfortably at home.  ?Head is normocephalic, atraumatic.  ?No labored breathing.  ?Speech is clear and coherent with logical content.  ?Patient ? ?Assessment  and Plan: ?1. Acute bacterial sinusitis ? ?2. History of asthma ? ?Instructed to increase fluids, humidifier at night, tylenol, medication use and side effects discussed, proceed to ED for worsening sx.  ? ?Follow Up Instructions: ?I discussed the assessment and treatment plan with the patient. The patient was provided an opportunity to ask questions and all were answered. The patient agreed with the plan and demonstrated an understanding of the instructions.  A copy of instructions were sent to the patient via MyChart unless otherwise noted below.  ? ? ? ?The patient was advised to call back or seek an in-person evaluation if the symptoms worsen or if the condition fails to improve as anticipated. ? ?Time:  ?I spent 10 minutes with the patient via telehealth technology discussing the above problems/concerns.   ? ?Caitlyn Nims, FNP ? ?

## 2022-02-21 ENCOUNTER — Other Ambulatory Visit: Payer: Self-pay | Admitting: Internal Medicine

## 2022-02-23 NOTE — Telephone Encounter (Signed)
Pt called, advised her she is due for appt for rx refill. Pt states that she doesn't currently need refill and is due to come in for OV in May/June. Pt would call back to schedule an appt later. Will refuse rx request.  ? ?Requested Prescriptions  ?Pending Prescriptions Disp Refills  ? ADVAIR DISKUS 100-50 MCG/ACT AEPB [Pharmacy Med Name: ADVAIR 100-50 DISKUS] 60 each   ?  Sig: INHALE 1 PUFF INTO THE LUNGS TWICE A DAY  ?  ? Pulmonology:  Combination Products Failed - 02/21/2022  9:14 AM  ?  ?  Failed - Valid encounter within last 12 months  ?  Recent Outpatient Visits   ?None ?  ?  ? ?  ?  ?  ? ? ?

## 2022-04-16 ENCOUNTER — Ambulatory Visit: Payer: Self-pay | Admitting: *Deleted

## 2022-04-16 NOTE — Telephone Encounter (Signed)
  Chief Complaint: L rib pain  Symptoms: pain in side with cough Frequency: Monday Pertinent Negatives: Patient denies back pain, diarrhea, fever, urination pain, vomiting Disposition: '[]'$ ED /'[x]'$ Urgent Care (no appt availability in office) / '[]'$ Appointment(In office/virtual)/ '[]'$  Agency Virtual Care/ '[]'$ Home Care/ '[]'$ Refused Recommended Disposition /'[]'$ [facility]/ '[]'$  Follow-up with PCP Additional Notes: Call to office no open appointment - patient advised UC  Reason for Disposition  [1] MODERATE pain (e.g., interferes with normal activities) AND [2] pain comes and goes (cramps) AND [3] present > 24 hours  (Exception: pain with Vomiting or Diarrhea - see that Guideline)  Answer Assessment - Initial Assessment Questions 1. LOCATION: "Where does it hurt?"      LL below ribs 2. RADIATION: "Does the pain shoot anywhere else?" (e.g., chest, back)     no 3. ONSET: "When did the pain begin?" (e.g., minutes, hours or days ago)      Monday 4. SUDDEN: "Gradual or sudden onset?"       5. PATTERN "Does the pain come and go, or is it constant?"    - If constant: "Is it getting better, staying the same, or worsening?"      (Note: Constant means the pain never goes away completely; most serious pain is constant and it progresses)     - If intermittent: "How long does it last?" "Do you have pain now?"     (Note: Intermittent means the pain goes away completely between bouts)     Pain with movement 6. SEVERITY: "How bad is the pain?"  (e.g., Scale 1-10; mild, moderate, or severe)   - MILD (1-3): doesn't interfere with normal activities, abdomen soft and not tender to touch    - MODERATE (4-7): interferes with normal activities or awakens from sleep, abdomen tender to touch    - SEVERE (8-10): excruciating pain, doubled over, unable to do any normal activities      moderate 7. RECURRENT SYMPTOM: "Have you ever had this type of stomach pain before?" If Yes, ask: "When was the last time?" and  "What happened that time?"      Feels similar to broken ribs 8. CAUSE: "What do you think is causing the stomach pain?"     Patient thinks she may have pulled something 9. RELIEVING/AGGRAVATING FACTORS: "What makes it better or worse?" (e.g., movement, antacids, bowel movement)     movement 10. OTHER SYMPTOMS: "Do you have any other symptoms?" (e.g., back pain, diarrhea, fever, urination pain, vomiting)       Mucus- cough 11. PREGNANCY: "Is there any chance you are pregnant?" "When was your last menstrual period?"  Protocols used: Abdominal Pain - [facility]

## 2022-04-16 NOTE — Telephone Encounter (Signed)
Will review using note when available.

## 2022-04-16 NOTE — Telephone Encounter (Signed)
FYI

## 2022-04-21 ENCOUNTER — Ambulatory Visit: Payer: Self-pay | Admitting: Family Medicine

## 2022-04-21 ENCOUNTER — Encounter: Payer: Self-pay | Admitting: Family Medicine

## 2022-04-21 VITALS — BP 158/110 | HR 131 | Ht 66.0 in | Wt 182.2 lb

## 2022-04-21 DIAGNOSIS — J309 Allergic rhinitis, unspecified: Secondary | ICD-10-CM

## 2022-04-21 DIAGNOSIS — R0781 Pleurodynia: Secondary | ICD-10-CM

## 2022-04-21 DIAGNOSIS — J4530 Mild persistent asthma, uncomplicated: Secondary | ICD-10-CM

## 2022-04-21 MED ORDER — LEVOCETIRIZINE DIHYDROCHLORIDE 5 MG PO TABS: 5.0000 mg | ORAL_TABLET | Freq: Every evening | ORAL | 1 refills | Status: AC

## 2022-04-21 MED ORDER — ADVAIR DISKUS 250-50 MCG/ACT IN AEPB: 1.0000 | INHALATION_SPRAY | Freq: Two times a day (BID) | RESPIRATORY_TRACT | 2 refills | Status: DC

## 2022-04-21 NOTE — Patient Instructions (Addendum)
Thank you for coming to the office today.  Try to work on improving muscle function around the rib that is injured.  Can use heating pad regularly and also muscle rub  START anti inflammatory topical - OTC Voltaren (generic Diclofenac) topical 2-4 times a day as needed for pain swelling of affected joint for 1-2 weeks or longer.  Can take ibuprofen / Tylenol as needed  Future X-ray if needed  Refilled Advair at higher dose and also Xyzal  Future muscle relaxant if not improving.  Please schedule a Follow-up Appointment to: Return if symptoms worsen or fail to improve.  If you have any other questions or concerns, please feel free to call the office or send a message through [facility]. You may also schedule an earlier appointment if necessary.  Additionally, you may be receiving a survey about your experience at our office within a few days to 1 week by e-mail or mail. We value your feedback.  Nobie Putnam, DO [facility]

## 2022-04-21 NOTE — Progress Notes (Signed)
Subjective:    Patient ID: Caitlyn Robertson, female    DOB: 07-Mar-1961, 61 y.o.   MRN: [id number]  Caitlyn Robertson is a 61 y.o. female presenting on 04/21/2022 for Shortness of Breath and Back Pain  Previous with PCP Webb Silversmith, FNP at [facility]  HPI  Mild Persistent Asthma Chronic Allergic rhinitis  She has had episodic recurrent issues with sinuses over past few months.  Usually if sedentary she is doing well with breathing. Has some component of exercise induced asthma flares.  Previously on Flovent, she had been switched to Advair unsure if improving. Out of maintenance therapy today. Albuterol PRN using it multiple times per day.  Recent updates, 10 days ago she had issue with bending forward and coughed  She is taking Advil and Tylenol PRN with some relief.  Admits difficulty   Localized Left mid rib with palm sized area of tenderness. Improved with direct pressure and support.  History of cracked rib in past with COVID.  PMH L knee issue limitation and prior hip surgery 2019.  History of hip replacement      05/08/2020    8:25 AM 02/21/2018    9:48 AM 10/10/2017    8:40 AM  Depression screen PHQ 2/9  Decreased Interest 0 0 0  Down, Depressed, Hopeless 0 0 0  PHQ - 2 Score 0 0 0    Social History   Tobacco Use   Smoking status: Never   Smokeless tobacco: Never  Vaping Use   Vaping Use: Never used  Substance Use Topics   Alcohol use: Yes    Comment: rare   Drug use: No    Review of Systems Per HPI unless specifically indicated above     Objective:    BP (!) 158/110   Pulse (!) 131   Ht '5\' 6"'$  (1.676 m)   Wt 182 lb 3.2 oz (82.6 kg)   SpO2 95%   BMI 29.41 kg/m   Wt Readings from Last 3 Encounters:  04/21/22 182 lb 3.2 oz (82.6 kg)  05/08/20 237 lb (107.5 kg)  10/03/18 214 lb (97.1 kg)    Physical Exam Vitals and nursing note reviewed.  Constitutional:      General: She is not in acute distress.    Appearance: Normal appearance. She  is well-developed. She is not diaphoretic.     Comments: Well-appearing, comfortable, cooperative  HENT:     Head: Normocephalic and atraumatic.  Eyes:     General:        Right eye: No discharge.        Left eye: No discharge.     Conjunctiva/sclera: Conjunctivae normal.  Neck:     Thyroid: No thyromegaly.  Cardiovascular:     Rate and Rhythm: Normal rate and regular rhythm.     Heart sounds: Normal heart sounds. No murmur heard. Pulmonary:     Effort: Pulmonary effort is normal. No respiratory distress.     Breath sounds: Normal breath sounds. No wheezing or rales.     Comments: Good air movement. Deep breathing does cause some L lateral rib discomfort over area in question. Mild tender to palpation as well over rib. Musculoskeletal:        General: Normal range of motion.     Cervical back: Normal range of motion and neck supple.  Lymphadenopathy:     Cervical: No cervical adenopathy.  Skin:    General: Skin is warm and dry.     Findings: No erythema  or rash.  Neurological:     Mental Status: She is alert and oriented to person, place, and time.  Psychiatric:        Mood and Affect: Mood normal.        Behavior: Behavior normal.        Thought Content: Thought content normal.     Comments: Well groomed, good eye contact, normal speech and thoughts   Results for orders placed or performed in visit on 05/08/20  CBC  Result Value Ref Range   WBC 7.0 4.0 - 10.5 K/uL   RBC 5.04 3.87 - 5.11 Mil/uL   Platelets 218.0 150.0 - 400.0 K/uL   Hemoglobin 14.5 12.0 - 15.0 g/dL   HCT 43.8 36.0 - 46.0 %   MCV 87.1 78.0 - 100.0 fl   MCHC 33.0 30.0 - 36.0 g/dL   RDW 15.5 11.5 - 15.5 %  Comprehensive metabolic panel  Result Value Ref Range   Sodium 141 135 - 145 mEq/L   Potassium 3.7 3.5 - 5.1 mEq/L   Chloride 106 96 - 112 mEq/L   CO2 28 19 - 32 mEq/L   Glucose, Bld 97 70 - 99 mg/dL   BUN 12 6 - 23 mg/dL   Creatinine, Ser 0.75 0.40 - 1.20 mg/dL   Total Bilirubin 0.4 0.2 - 1.2  mg/dL   Alkaline Phosphatase 56 39 - 117 U/L   AST 14 0 - 37 U/L   ALT 18 0 - 35 U/L   Total Protein 7.1 6.0 - 8.3 g/dL   Albumin 4.3 3.5 - 5.2 g/dL   GFR 78.87 >60.00 mL/min   Calcium 9.2 8.4 - 10.5 mg/dL  Lipid panel  Result Value Ref Range   Cholesterol 156 0 - 200 mg/dL   Triglycerides 46.0 0.0 - 149.0 mg/dL   HDL 61.70 >39.00 mg/dL   VLDL 9.2 0.0 - 40.0 mg/dL   LDL Cholesterol 85 0 - 99 mg/dL   Total CHOL/HDL Ratio 3    NonHDL 94.50   Hemoglobin A1c  Result Value Ref Range   Hgb A1c MFr Bld 5.9 4.6 - 6.5 %  VITAMIN D 25 Hydroxy (Vit-D Deficiency, Fractures)  Result Value Ref Range   VITD 36.28 30.00 - 100.00 ng/mL      Assessment & Plan:   Problem List Items Addressed This Visit     Allergy-induced asthma - Primary   Relevant Medications   ADVAIR DISKUS 250-50 MCG/ACT AEPB   Other Visit Diagnoses     Chronic allergic rhinitis       Relevant Medications   levocetirizine (XYZAL) 5 MG tablet   Rib pain on left side           Asthma without flare Dose increase Advair from 100-50 to 250-50 1 puff BID for asthma maintenance therapy She is using inc amount of breakthrough Albuterol PRN and recent illness has impacted her asthma  Allergies persistent Difficulty with sinus drainage still impacting her, can continue Flonase, Xyzal, Singulair May need future allergy consult if indicated  L Rib Pain Likely acute rib strain or injury, could have been mild dislocation/temporary or fracture. Localized to L mid ribs lateral, limited results in past 10 days. Has had provoking and repeat episodes of coughing. Consider Rib series X-ray will defer for now, gradual improvement anticipated over few weeks, treat underlying asthma better now.  Using topical heating pad / voltaren PRN and can focus on massaging soft tissue in this area work on improving rib movement deep breathing  Meds ordered this encounter  Medications   levocetirizine (XYZAL) 5 MG tablet    Sig: Take 1  tablet (5 mg total) by mouth every evening.    Dispense:  90 tablet    Refill:  1   ADVAIR DISKUS 250-50 MCG/ACT AEPB    Sig: Inhale 1 puff into the lungs in the morning and at bedtime.    Dispense:  60 each    Refill:  2      Follow up plan: Return if symptoms worsen or fail to improve.   Nobie Putnam, [facility] 04/21/2022, 1:25 PM

## 2022-05-03 ENCOUNTER — Ambulatory Visit: Payer: Self-pay | Admitting: *Deleted

## 2022-05-03 NOTE — Telephone Encounter (Signed)
Summary: med question   Pt was put on celebrex 1/day 200 mg by her ortho surgeon.  after reading the info on this drug, she read it may interact w/ her lisinopril (ZESTRIL) 40 MG tablet.  Pt would like call back to discuss    Micromedex: Concurrent use of ACE INHIBITORS AND ANGIOTENSIN RECEPTOR BLOCKERS and NSAIDS may result in reduced antihypertensive effect and renal dysfunction and/ or increased blood pressure.   Call to patient- she has been prescribed:  Tizanidine, Celebrex, tramadol She just wants to make sure PCP is aware and it is ok.  Reason for Disposition  [1] Caller has URGENT medicine question about med that PCP or specialist prescribed AND [2] triager unable to answer question  Answer Assessment - Initial Assessment Questions 1. NAME of MEDICATION: "What medicine are you calling about?"     Celebrex and Lisinopril interaction 2. QUESTION: "What is your question?" (e.g., double dose of medicine, side effect)     Is it safe to take these medications together 3. PRESCRIBING HCP: "Who prescribed it?" Reason: if prescribed by specialist, call should be referred to that group.     PCP/Outside provider- patient to start PT  Protocols used: Medication Question Call-[facility]

## 2022-05-05 NOTE — Telephone Encounter (Signed)
Left message advising pt.  PEC please advise if she calls back.   Thanks,   -Hason Ofarrell  

## 2022-05-05 NOTE — Telephone Encounter (Signed)
These medications are commonly taken together.  I am not concerned about any interaction.

## 2022-05-13 ENCOUNTER — Ambulatory Visit (INDEPENDENT_AMBULATORY_CARE_PROVIDER_SITE_OTHER): Payer: BC Managed Care – PPO | Admitting: Internal Medicine

## 2022-05-13 ENCOUNTER — Encounter: Payer: Self-pay | Admitting: Internal Medicine

## 2022-05-13 VITALS — BP 146/92 | HR 111 | Temp 96.2°F | Ht 65.0 in | Wt 160.0 lb

## 2022-05-13 DIAGNOSIS — J4530 Mild persistent asthma, uncomplicated: Secondary | ICD-10-CM

## 2022-05-13 DIAGNOSIS — Z1159 Encounter for screening for other viral diseases: Secondary | ICD-10-CM

## 2022-05-13 DIAGNOSIS — Z780 Asymptomatic menopausal state: Secondary | ICD-10-CM

## 2022-05-13 DIAGNOSIS — Z1231 Encounter for screening mammogram for malignant neoplasm of breast: Secondary | ICD-10-CM | POA: Diagnosis not present

## 2022-05-13 DIAGNOSIS — R7303 Prediabetes: Secondary | ICD-10-CM | POA: Diagnosis not present

## 2022-05-13 DIAGNOSIS — Z0001 Encounter for general adult medical examination with abnormal findings: Secondary | ICD-10-CM

## 2022-05-13 DIAGNOSIS — Z114 Encounter for screening for human immunodeficiency virus [HIV]: Secondary | ICD-10-CM

## 2022-05-13 DIAGNOSIS — L659 Nonscarring hair loss, unspecified: Secondary | ICD-10-CM

## 2022-05-13 DIAGNOSIS — Z1211 Encounter for screening for malignant neoplasm of colon: Secondary | ICD-10-CM

## 2022-05-13 DIAGNOSIS — Z6826 Body mass index (BMI) 26.0-26.9, adult: Secondary | ICD-10-CM

## 2022-05-13 DIAGNOSIS — E663 Overweight: Secondary | ICD-10-CM

## 2022-05-13 DIAGNOSIS — K769 Liver disease, unspecified: Secondary | ICD-10-CM

## 2022-05-13 DIAGNOSIS — R748 Abnormal levels of other serum enzymes: Secondary | ICD-10-CM

## 2022-05-13 MED ORDER — FLUTICASONE PROPIONATE HFA 110 MCG/ACT IN AERO: 1.0000 | INHALATION_SPRAY | Freq: Two times a day (BID) | RESPIRATORY_TRACT | 2 refills | Status: DC

## 2022-05-13 MED ORDER — ALBUTEROL SULFATE HFA 108 (90 BASE) MCG/ACT IN AERS: 1.0000 | INHALATION_SPRAY | RESPIRATORY_TRACT | 2 refills | Status: DC | PRN

## 2022-05-13 NOTE — Patient Instructions (Signed)
Health Maintenance for Postmenopausal Women Menopause is a normal process in which your ability to get pregnant comes to an end. This process happens slowly over many months or years, usually between the ages of 48 and 55. Menopause is complete when you have missed your menstrual period for 12 months. It is important to talk with your health care provider about some of the most common conditions that affect women after menopause (postmenopausal women). These include heart disease, cancer, and bone loss (osteoporosis). Adopting a healthy lifestyle and getting preventive care can help to promote your health and wellness. The actions you take can also lower your chances of developing some of these common conditions. What are the signs and symptoms of menopause? During menopause, you may have the following symptoms: Hot flashes. These can be moderate or severe. Night sweats. Decrease in sex drive. Mood swings. Headaches. Tiredness (fatigue). Irritability. Memory problems. Problems falling asleep or staying asleep. Talk with your health care provider about treatment options for your symptoms. Do I need hormone replacement therapy? Hormone replacement therapy is effective in treating symptoms that are caused by menopause, such as hot flashes and night sweats. Hormone replacement carries certain risks, especially as you become older. If you are thinking about using estrogen or estrogen with progestin, discuss the benefits and risks with your health care provider. How can I reduce my risk for heart disease and stroke? The risk of heart disease, heart attack, and stroke increases as you age. One of the causes may be a change in the body's hormones during menopause. This can affect how your body uses dietary fats, triglycerides, and cholesterol. Heart attack and stroke are medical emergencies. There are many things that you can do to help prevent heart disease and stroke. Watch your blood pressure High  blood pressure causes heart disease and increases the risk of stroke. This is more likely to develop in people who have high blood pressure readings or are overweight. Have your blood pressure checked: Every 3-5 years if you are 18-39 years of age. Every year if you are 40 years old or older. Eat a healthy diet  Eat a diet that includes plenty of vegetables, fruits, low-fat dairy products, and lean protein. Do not eat a lot of foods that are high in solid fats, added sugars, or sodium. Get regular exercise Get regular exercise. This is one of the most important things you can do for your health. Most adults should: Try to exercise for at least 150 minutes each week. The exercise should increase your heart rate and make you sweat (moderate-intensity exercise). Try to do strengthening exercises at least twice each week. Do these in addition to the moderate-intensity exercise. Spend less time sitting. Even light physical activity can be beneficial. Other tips Work with your health care provider to achieve or maintain a healthy weight. Do not use any products that contain nicotine or tobacco. These products include cigarettes, chewing tobacco, and vaping devices, such as e-cigarettes. If you need help quitting, ask your health care provider. Know your numbers. Ask your health care provider to check your cholesterol and your blood sugar (glucose). Continue to have your blood tested as directed by your health care provider. Do I need screening for cancer? Depending on your health history and family history, you may need to have cancer screenings at different stages of your life. This may include screening for: Breast cancer. Cervical cancer. Lung cancer. Colorectal cancer. What is my risk for osteoporosis? After menopause, you may be   at increased risk for osteoporosis. Osteoporosis is a condition in which bone destruction happens more quickly than new bone creation. To help prevent osteoporosis or  the bone fractures that can happen because of osteoporosis, you may take the following actions: If you are 19-50 years old, get at least 1,000 mg of calcium and at least 600 international units (IU) of vitamin D per day. If you are older than age 50 but younger than age 70, get at least 1,200 mg of calcium and at least 600 international units (IU) of vitamin D per day. If you are older than age 70, get at least 1,200 mg of calcium and at least 800 international units (IU) of vitamin D per day. Smoking and drinking excessive alcohol increase the risk of osteoporosis. Eat foods that are rich in calcium and vitamin D, and do weight-bearing exercises several times each week as directed by your health care provider. How does menopause affect my mental health? Depression may occur at any age, but it is more common as you become older. Common symptoms of depression include: Feeling depressed. Changes in sleep patterns. Changes in appetite or eating patterns. Feeling an overall lack of motivation or enjoyment of activities that you previously enjoyed. Frequent crying spells. Talk with your health care provider if you think that you are experiencing any of these symptoms. General instructions See your health care provider for regular wellness exams and vaccines. This may include: Scheduling regular health, dental, and eye exams. Getting and maintaining your vaccines. These include: Influenza vaccine. Get this vaccine each year before the flu season begins. Pneumonia vaccine. Shingles vaccine. Tetanus, diphtheria, and pertussis (Tdap) booster vaccine. Your health care provider may also recommend other immunizations. Tell your health care provider if you have ever been abused or do not feel safe at home. Summary Menopause is a normal process in which your ability to get pregnant comes to an end. This condition causes hot flashes, night sweats, decreased interest in sex, mood swings, headaches, or lack  of sleep. Treatment for this condition may include hormone replacement therapy. Take actions to keep yourself healthy, including exercising regularly, eating a healthy diet, watching your weight, and checking your blood pressure and blood sugar levels. Get screened for cancer and depression. Make sure that you are up to date with all your vaccines. This information is not intended to replace advice given to you by your health care provider. Make sure you discuss any questions you have with your health care provider. Document Revised: 03/30/2021 Document Reviewed: 03/30/2021 Elsevier Patient Education  2023 [employer]  

## 2022-05-13 NOTE — Progress Notes (Signed)
Subjective:    Patient ID: Caitlyn Robertson, female    DOB: 04-Nov-1961, 61 y.o.   MRN: [id number]  HPI  Patient presents to clinic today for her annual exam.  Flu: 09/2017 Tetanus: < 10 years ago COVID: never Shingrix: never Pap smear: Hysterectomy Mammogram: > 2 years ago Bone density: > 2 years ago Colon screening: never Vision screening: annually Dentist: biannually  Diet: She does eat some meat. She consumes fruits and veggies. She tries to avoid fried foods. She drinks mostly water. Exercise: Doing PT  Review of Systems  Past Medical History:  Diagnosis Date   Allergy    Arthritis    Asthma    History of chicken pox    Hypertension     Current Outpatient Medications  Medication Sig Dispense Refill   acetaminophen (TYLENOL) 500 MG tablet Take 500 mg by mouth as needed.     ADVAIR DISKUS 250-50 MCG/ACT AEPB Inhale 1 puff into the lungs in the morning and at bedtime. 60 each 2   albuterol (VENTOLIN HFA) 108 (90 Base) MCG/ACT inhaler Inhale 1-2 puffs into the lungs every 4 (four) hours as needed for wheezing or shortness of breath. 24 g 2   fluticasone (FLONASE) 50 MCG/ACT nasal spray Place into both nostrils daily.     levocetirizine (XYZAL) 5 MG tablet Take 1 tablet (5 mg total) by mouth every evening. 90 tablet 1   lisinopril (ZESTRIL) 40 MG tablet Take 1 tablet (40 mg total) by mouth daily. 90 tablet 3   montelukast (SINGULAIR) 10 MG tablet TAKE 1 TABLET BY MOUTH EVERYDAY AT BEDTIME 90 tablet 3   No current facility-administered medications for this visit.    Allergies  Allergen Reactions   Oxycodone     Family History  Problem Relation Age of Onset   Arthritis Mother    Colon cancer Mother    Stroke Mother    Arthritis Father    Asthma Father    Diabetes Father    Hypertension Father    Asthma Daughter    Stroke Maternal Grandmother    Arthritis Maternal Grandfather    Depression Maternal Grandfather    Heart disease Paternal Grandfather     Arthritis Paternal Grandfather     Social History   Socioeconomic History   Marital status: Single    Spouse name: Not on file   Number of children: Not on file   Years of education: Not on file   Highest education level: Not on file  Occupational History   Not on file  Tobacco Use   Smoking status: Never   Smokeless tobacco: Never  Vaping Use   Vaping Use: Never used  Substance and Sexual Activity   Alcohol use: Yes    Comment: rare   Drug use: No   Sexual activity: Not on file  Other Topics Concern   Not on file  Social History Narrative   Not on file   Social Determinants of Health   Financial Resource Strain: Not on file  Food Insecurity: Not on file  Transportation Needs: Not on file  Physical Activity: Not on file  Stress: Not on file  Social Connections: Not on file  Intimate Partner Violence: Not on file     Constitutional: Pt reports fatigue, unintentional weight loss. Denies fever, malaise, headache.  HEENT: Pt reports thinning hair. Denies eye pain, eye redness, ear pain, ringing in the ears, wax buildup, runny nose, nasal congestion, bloody nose, or sore throat. Respiratory: Pt reports  cough. Denies difficulty breathing, shortness of breath, or sputum production.   Cardiovascular: Denies chest pain, chest tightness, palpitations or swelling in the hands or feet.  Gastrointestinal: Denies abdominal pain, bloating, constipation, diarrhea or blood in the stool.  GU: Denies urgency, frequency, pain with urination, burning sensation, blood in urine, odor or discharge. Musculoskeletal: Patient reports intermittent hip pain, generalized weakness.  Denies decrease in range of motion, muscle pain or joint swelling.  Skin: Denies redness, rashes, lesions or ulcercations.  Neurological: Denies dizziness, difficulty with memory, difficulty with speech or problems with balance and coordination.  Psych: Denies anxiety, depression, SI/HI.  No other specific complaints  in a complete review of systems (except as listed in HPI above).     Objective:   Physical Exam  BP (!) 152/102 (BP Location: Right Arm, Patient Position: Sitting, Cuff Size: Large)   Pulse (!) 123   Temp (!) 96.2 F (35.7 C) (Temporal)   Ht _0  (1.651 m) Comment: Per pt  Wt 160 lb (72.6 kg) Comment: Per pt  SpO2 94%   BMI 26.63 kg/m   Wt Readings from Last 3 Encounters:  04/21/22 182 lb 3.2 oz (82.6 kg)  05/08/20 237 lb (107.5 kg)  10/03/18 214 lb (97.1 kg)    General: Appears her stated age, chronically ill appearing, in NAD. Skin: Warm, dry and intact.  HEENT: Head: normal shape and size; Eyes: sclera white, no icterus, conjunctiva pink, PERRLA and EOMs intact;  Neck:  Neck supple, trachea midline. No masses, lumps or thyromegaly present.  Cardiovascular: Tachycardic with normal rhythm. S1,S2 noted.  No murmur, rubs or gallops noted. No JVD or BLE edema. No carotid bruits noted. Pulmonary/Chest: Normal effort and positive vesicular breath sounds. No respiratory distress. No wheezes, rales or ronchi noted.  Abdomen: Soft and nontender. Normal bowel sounds.  Musculoskeletal: Strength 4/5 BUE, 3/5 BLE. In wheelchair today. Muscle wasting noted.  Neurological: Alert and oriented. Cranial nerves II-XII grossly intact. Coordination normal.  Psychiatric: Mood and affect normal. Behavior is normal. Judgment and thought content normal.    BMET    Component Value Date/Time   NA 141 05/08/2020 0908   K 3.7 05/08/2020 0908   CL 106 05/08/2020 0908   CO2 28 05/08/2020 0908   GLUCOSE 97 05/08/2020 0908   BUN 12 05/08/2020 0908   CREATININE 0.75 05/08/2020 0908   CALCIUM 9.2 05/08/2020 0908    Lipid Panel     Component Value Date/Time   CHOL 156 05/08/2020 0908   TRIG 46.0 05/08/2020 0908   HDL 61.70 05/08/2020 0908   CHOLHDL 3 05/08/2020 0908   VLDL 9.2 05/08/2020 0908   LDLCALC 85 05/08/2020 0908    CBC    Component Value Date/Time   WBC 7.0 05/08/2020 0908   RBC  5.04 05/08/2020 0908   HGB 14.5 05/08/2020 0908   HCT 43.8 05/08/2020 0908   PLT 218.0 05/08/2020 0908   MCV 87.1 05/08/2020 0908   MCHC 33.0 05/08/2020 0908   RDW 15.5 05/08/2020 0908    Hgb A1C Lab Results  Component Value Date   HGBA1C 5.9 05/08/2020           Assessment & Plan:   Preventative Health Maintenance:  Encouraged her to get a flu shot in the fall Tetanus UTD per her report Encouraged her to get a COVID-vaccine Discussed Shingrix vaccine, she will check coverage with her insurance company and schedule a nurse visit if she would like to have this done She no longer  needs Pap smears Mammogram and bone density ordered-she will call to schedule She declines colonoscopy of Cologuard at this time Encouraged her to consume a balanced diet and exercise regimen Advised her to see an eye doctor and dentist annually We will check CBC, c-Met, lipid, A1c, HIV and hep C today  Thinning Hair:  TSH today  RTC in 6 months, follow-up chronic conditions Webb Silversmith, NP

## 2022-05-13 NOTE — Assessment & Plan Note (Signed)
Recent muscle wasting due to sickness Will monitor

## 2022-05-14 LAB — CBC
HCT: 31.9 % — ABNORMAL LOW (ref 35.0–45.0)
Hemoglobin: 10.7 g/dL — ABNORMAL LOW (ref 11.7–15.5)
MCH: 29.5 pg (ref 27.0–33.0)
MCHC: 33.5 g/dL (ref 32.0–36.0)
MCV: 87.9 fL (ref 80.0–100.0)
MPV: 10.3 fL (ref 7.5–12.5)
Platelets: 118 10*3/uL — ABNORMAL LOW (ref 140–400)
RBC: 3.63 10*6/uL — ABNORMAL LOW (ref 3.80–5.10)
RDW: 19.4 % — ABNORMAL HIGH (ref 11.0–15.0)
WBC: 4.9 10*3/uL (ref 3.8–10.8)

## 2022-05-14 LAB — COMPLETE METABOLIC PANEL WITH GFR
AG Ratio: 1.6 (calc) (ref 1.0–2.5)
ALT: 37 U/L — ABNORMAL HIGH (ref 6–29)
AST: 123 U/L — ABNORMAL HIGH (ref 10–35)
Albumin: 3.9 g/dL (ref 3.6–5.1)
Alkaline phosphatase (APISO): 114 U/L (ref 37–153)
BUN: 17 mg/dL (ref 7–25)
CO2: 23 mmol/L (ref 20–32)
Calcium: 10.1 mg/dL (ref 8.6–10.4)
Chloride: 100 mmol/L (ref 98–110)
Creat: 0.83 mg/dL (ref 0.50–1.05)
Globulin: 2.4 g/dL (calc) (ref 1.9–3.7)
Glucose, Bld: 115 mg/dL (ref 65–139)
Potassium: 3.2 mmol/L — ABNORMAL LOW (ref 3.5–5.3)
Sodium: 140 mmol/L (ref 135–146)
Total Bilirubin: 1.2 mg/dL (ref 0.2–1.2)
Total Protein: 6.3 g/dL (ref 6.1–8.1)
eGFR: 80 mL/min/{1.73_m2} (ref >=60)

## 2022-05-14 LAB — LIPID PANEL
Cholesterol: 155 mg/dL (ref <200)
HDL: 40 mg/dL — ABNORMAL LOW (ref >=50)
LDL Cholesterol (Calc): 89 mg/dL (calc)
Non-HDL Cholesterol (Calc): 115 mg/dL (calc) (ref <130)
Total CHOL/HDL Ratio: 3.9 (calc) (ref <5.0)
Triglycerides: 166 mg/dL — ABNORMAL HIGH (ref <150)

## 2022-05-14 LAB — HEPATITIS C ANTIBODY: Hepatitis C Ab: NONREACTIVE

## 2022-05-14 LAB — TSH: TSH: 1.73 mIU/L (ref 0.40–4.50)

## 2022-05-14 LAB — HIV ANTIBODY (ROUTINE TESTING W REFLEX): HIV 1&2 Ab, 4th Generation: NONREACTIVE

## 2022-05-14 LAB — HEMOGLOBIN A1C
Hgb A1c MFr Bld: 5.5 % of total Hgb (ref <5.7)
Mean Plasma Glucose: 111 mg/dL
eAG (mmol/L): 6.2 mmol/L

## 2022-05-17 ENCOUNTER — Ambulatory Visit: Payer: Self-pay

## 2022-05-18 MED ORDER — BENZONATATE 200 MG PO CAPS: 200.0000 mg | ORAL_CAPSULE | Freq: Three times a day (TID) | ORAL | 0 refills | Status: AC | PRN

## 2022-05-24 ENCOUNTER — Ambulatory Visit
Admission: RE | Admit: 2022-05-24 | Discharge: 2022-05-24 | Disposition: A | Discharge status: Home / Self Care | Payer: BC Managed Care – PPO | Source: Ambulatory Visit | Attending: Internal Medicine | Admitting: Internal Medicine

## 2022-05-24 DIAGNOSIS — R748 Abnormal levels of other serum enzymes: Secondary | ICD-10-CM | POA: Diagnosis present

## 2022-05-26 ENCOUNTER — Telehealth: Payer: Self-pay

## 2022-05-26 NOTE — Addendum Note (Signed)
Addended by: Jearld Fenton on: 05/26/2022 07:42 AM   Modules accepted: Orders

## 2022-05-26 NOTE — Telephone Encounter (Signed)
Copied from [place] [id number]. Topic: General - Other >> May 26, 2022  1:16 PM [name] wrote: Reason for CRM: Pt wanted to discuss having an MRI done but declined to set up an appt, pt wanted an order sent in to get an MRI done, please advise. >> May 26, 2022  1:26 PM [name] wrote: Pt stated she is trying to schedule an MRI and has questions for [place] or Mickel Baas. Pt is requesting a call back.

## 2022-05-26 NOTE — Telephone Encounter (Signed)
Ms. Guzzetta states she is very claustrophobic and also will not be able to be flat on her back for any amount of time due to back and hip surgery.    She wanted to know if there was a different option for imaging.  She states she does not tolerate medications like benzo or any pain medicines.  She also tried to schedule an MRI under anesthesia but was told that would be months out.     Thanks,   -Mickel Baas

## 2022-05-27 NOTE — Telephone Encounter (Signed)
Did she request to go in feet first?  Some places have an open MRI.  She can call around and let me know when imaging placed that has 1 of those that she would like to go.  I really would recommend she get this MRI given her recent health issues and her elevated liver enzymes.

## 2022-06-25 ENCOUNTER — Other Ambulatory Visit: Payer: Self-pay

## 2022-06-25 ENCOUNTER — Encounter: Payer: Self-pay | Admitting: Internal Medicine

## 2022-06-25 DIAGNOSIS — J4530 Mild persistent asthma, uncomplicated: Secondary | ICD-10-CM

## 2022-06-25 MED ORDER — ALBUTEROL SULFATE HFA 108 (90 BASE) MCG/ACT IN AERS: 1.0000 | INHALATION_SPRAY | RESPIRATORY_TRACT | 2 refills | Status: AC | PRN

## 2022-07-02 ENCOUNTER — Encounter: Payer: Self-pay | Admitting: Internal Medicine

## 2022-07-10 ENCOUNTER — Other Ambulatory Visit: Payer: Self-pay | Admitting: Internal Medicine

## 2022-07-10 ENCOUNTER — Other Ambulatory Visit: Payer: Self-pay | Admitting: Family Medicine

## 2022-07-10 DIAGNOSIS — I10 Essential (primary) hypertension: Secondary | ICD-10-CM

## 2022-07-10 DIAGNOSIS — J4530 Mild persistent asthma, uncomplicated: Secondary | ICD-10-CM

## 2022-07-12 NOTE — Telephone Encounter (Signed)
Requested medication (s) are due for refill today -expired Rx  Requested medication (s) are on the active medication list -yes  Future visit scheduled -no  Last refill: 07/03/21 #90 3RF  Notes to clinic: expired Rx- not reviewed at last visit  Requested Prescriptions  Pending Prescriptions Disp Refills   lisinopril (ZESTRIL) 40 MG tablet [Pharmacy Med Name: LISINOPRIL 40 MG TABLET] 90 tablet 3    Sig: TAKE 1 TABLET BY MOUTH EVERY DAY     Cardiovascular:  ACE Inhibitors Failed - 07/10/2022  9:25 AM      Failed - K in normal range and within 180 days    Potassium  Date Value Ref Range Status  05/13/2022 3.2 (L) 3.5 - 5.3 mmol/L Final         Failed - Last BP in normal range    BP Readings from Last 1 Encounters:  05/13/22 (!) 146/92         Passed - Cr in normal range and within 180 days    Creat  Date Value Ref Range Status  05/13/2022 0.83 0.50 - 1.05 mg/dL Final         Passed - Patient is not pregnant      Passed - Valid encounter within last 6 months    Recent Outpatient Visits           2 months ago Encounter for screening mammogram for malignant neoplasm of breast   [facility] [place], [name], NP   2 months ago 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   [place], DO               montelukast (SINGULAIR) 10 MG tablet [Pharmacy Med Name: MONTELUKAST SOD 10 MG TABLET] 90 tablet 3    Sig: TAKE 1 TABLET BY MOUTH EVERYDAY AT BEDTIME     Pulmonology:  Leukotriene Inhibitors Passed - 07/10/2022  9:25 AM      Passed - Valid encounter within last 12 months    Recent Outpatient Visits           2 months ago Encounter for screening mammogram for malignant neoplasm of breast   [facility] [place], [name], NP   2 months ago 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   [place], DO                 Requested  Prescriptions  Pending Prescriptions Disp Refills   lisinopril (ZESTRIL) 40 MG tablet [Pharmacy Med Name: LISINOPRIL 40 MG TABLET] 90 tablet 3    Sig: TAKE 1 TABLET BY MOUTH EVERY DAY     Cardiovascular:  ACE Inhibitors Failed - 07/10/2022  9:25 AM      Failed - K in normal range and within 180 days    Potassium  Date Value Ref Range Status  05/13/2022 3.2 (L) 3.5 - 5.3 mmol/L Final         Failed - Last BP in normal range    BP Readings from Last 1 Encounters:  05/13/22 (!) 146/92         Passed - Cr in normal range and within 180 days    Creat  Date Value Ref Range Status  05/13/2022 0.83 0.50 - 1.05 mg/dL Final         Passed - Patient is not pregnant      Passed - Valid encounter within last 6 months    Recent  Outpatient Visits           2 months ago Encounter for screening mammogram for malignant neoplasm of breast   [facility] [place], [place], NP   2 months ago 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

## 2022-07-13 NOTE — Telephone Encounter (Signed)
Requested medication (s) are due for refill today: No  Requested medication (s) are on the active medication list: Yes  Last refill:  05/13/22  Future visit scheduled: No  Notes to clinic:  Pharmacy request alternate medication.    Requested Prescriptions  Pending Prescriptions Disp Refills   PULMICORT FLEXHALER 180 MCG/ACT inhaler [Pharmacy Med Name: PULMICORT 180 MCG FLEXHALER]  0    Sig: Inhale 1 puff into the lungs 2 (two) times daily.     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

## 2022-07-13 NOTE — Telephone Encounter (Signed)
Has this patient's MRI been scheduled that was ordered on July 5?

## 2022-07-29 ENCOUNTER — Telehealth: Payer: Self-pay | Admitting: *Deleted

## 2022-07-29 NOTE — Telephone Encounter (Signed)
  Chief Complaint: Results TSH from 05/13/22 Symptoms: NA Frequency: NA Pertinent Negatives: Patient denies NA Disposition: '[]'$ ED /'[]'$ Urgent Care (no appt availability in office) / '[]'$ Appointment(In office/virtual)/ '[]'$  [facility] [facility]/ '[]'$ Home Care/ '[]'$ Refused Recommended Disposition /'[]'$ [place] [facility]/ '[]'$  Follow-up with PCP Additional Notes: Pt calling to see if TSH was done in June (05/13/22) with labs drawn.  Assured TSH was drawn and in range. States "We're just trying to figure all this out and I thought I'd ask about my thyroid." States she has heard from GI but has not scheduled appt.  Pt has upcoming appt. With PCP 08/06/22. Advised to call back for any additional questions.

## 2022-08-06 ENCOUNTER — Ambulatory Visit: Payer: BC Managed Care – PPO | Admitting: Internal Medicine

## 2022-08-06 NOTE — Progress Notes (Deleted)
Subjective:    Patient ID: Caitlyn Robertson, female    DOB: Oct 24, 1961, 61 y.o.   MRN: [id number]  HPI  Patient presents to clinic today with complaint of cough.  This started.  Review of Systems     Past Medical History:  Diagnosis Date   Allergy    Arthritis    Asthma    History of chicken pox    Hypertension     Current Outpatient Medications  Medication Sig Dispense Refill   acetaminophen (TYLENOL) 500 MG tablet Take 500 mg by mouth as needed.     albuterol (VENTOLIN HFA) 108 (90 Base) MCG/ACT inhaler Inhale 1-2 puffs into the lungs every 4 (four) hours as needed for wheezing or shortness of breath. 24 g 2   benzonatate (TESSALON) 200 MG capsule Take 1 capsule (200 mg total) by mouth 3 (three) times daily as needed for cough. 30 capsule 0   budesonide (PULMICORT FLEXHALER) 180 MCG/ACT inhaler Inhale 1 puff into the lungs in the morning and at bedtime. 1 each 0   fluticasone (FLONASE) 50 MCG/ACT nasal spray Place into both nostrils daily.     levocetirizine (XYZAL) 5 MG tablet Take 1 tablet (5 mg total) by mouth every evening. 90 tablet 1   lisinopril (ZESTRIL) 40 MG tablet TAKE 1 TABLET BY MOUTH EVERY DAY 90 tablet 0   montelukast (SINGULAIR) 10 MG tablet TAKE 1 TABLET BY MOUTH EVERYDAY AT BEDTIME 90 tablet 0   No current facility-administered medications for this visit.    Allergies  Allergen Reactions   Oxycodone     Family History  Problem Relation Age of Onset   Arthritis Mother    Colon cancer Mother    Stroke Mother    Arthritis Father    Asthma Father    Diabetes Father    Hypertension Father    Asthma Daughter    Stroke Maternal Grandmother    Arthritis Maternal Grandfather    Depression Maternal Grandfather    Heart disease Paternal Grandfather    Arthritis Paternal Grandfather     Social History   Socioeconomic History   Marital status: Single    Spouse name: Not on file   Number of children: Not on file   Years of education: Not on file    Highest education level: Not on file  Occupational History   Not on file  Tobacco Use   Smoking status: Never   Smokeless tobacco: Never  Vaping Use   Vaping Use: Never used  Substance and Sexual Activity   Alcohol use: Yes    Comment: rare   Drug use: No   Sexual activity: Not on file  Other Topics Concern   Not on file  Social History Narrative   Not on file   Social Determinants of Health   Financial Resource Strain: Not on file  Food Insecurity: Not on file  Transportation Needs: Not on file  Physical Activity: Not on file  Stress: Not on file  Social Connections: Not on file  Intimate Partner Violence: Not on file     Constitutional: Denies fever, malaise, fatigue, headache or abrupt weight changes.  HEENT: Denies eye pain, eye redness, ear pain, ringing in the ears, wax buildup, runny nose, nasal congestion, bloody nose, or sore throat. Respiratory: Patient reports cough.  Denies difficulty breathing, shortness of breath, or sputum production.   Cardiovascular: Denies chest pain, chest tightness, palpitations or swelling in the hands or feet.  Gastrointestinal: Denies abdominal pain, bloating,  constipation, diarrhea or blood in the stool.  GU: Denies urgency, frequency, pain with urination, burning sensation, blood in urine, odor or discharge. Musculoskeletal: Denies decrease in range of motion, difficulty with gait, muscle pain or joint pain and swelling.  Skin: Denies redness, rashes, lesions or ulcercations.  Neurological: Denies dizziness, difficulty with memory, difficulty with speech or problems with balance and coordination.  Psych: Denies anxiety, depression, SI/HI.  No other specific complaints in a complete review of systems (except as listed in HPI above).  Objective:   Physical Exam  There were no vitals taken for this visit. Wt Readings from Last 3 Encounters:  05/13/22 160 lb (72.6 kg)  04/21/22 182 lb 3.2 oz (82.6 kg)  05/08/20 237 lb (107.5  kg)    General: Appears their stated age, well developed, well nourished in NAD. Skin: Warm, dry and intact. No rashes, lesions or ulcerations noted. HEENT: Head: normal shape and size; Eyes: sclera white, no icterus, conjunctiva pink, PERRLA and EOMs intact; Ears: Tm's gray and intact, normal light reflex; Nose: mucosa pink and moist, septum midline; Throat/Mouth: Teeth present, mucosa pink and moist, no exudate, lesions or ulcerations noted.  Neck:  Neck supple, trachea midline. No masses, lumps or thyromegaly present.  Cardiovascular: Normal rate and rhythm. S1,S2 noted.  No murmur, rubs or gallops noted. No JVD or BLE edema. No carotid bruits noted. Pulmonary/Chest: Normal effort and positive vesicular breath sounds. No respiratory distress. No wheezes, rales or ronchi noted.  Abdomen: Soft and nontender. Normal bowel sounds. No distention or masses noted. Liver, spleen and kidneys non palpable. Musculoskeletal: Normal range of motion. No signs of joint swelling. No difficulty with gait.  Neurological: Alert and oriented. Cranial nerves II-XII grossly intact. Coordination normal.  Psychiatric: Mood and affect normal. Behavior is normal. Judgment and thought content normal.    BMET    Component Value Date/Time   NA 140 05/13/2022 1332   K 3.2 (L) 05/13/2022 1332   CL 100 05/13/2022 1332   CO2 23 05/13/2022 1332   GLUCOSE 115 05/13/2022 1332   BUN 17 05/13/2022 1332   CREATININE 0.83 05/13/2022 1332   CALCIUM 10.1 05/13/2022 1332    Lipid Panel     Component Value Date/Time   CHOL 155 05/13/2022 1332   TRIG 166 (H) 05/13/2022 1332   HDL 40 (L) 05/13/2022 1332   CHOLHDL 3.9 05/13/2022 1332   VLDL 9.2 05/08/2020 0908   LDLCALC 89 05/13/2022 1332    CBC    Component Value Date/Time   WBC 4.9 05/13/2022 1332   RBC 3.63 (L) 05/13/2022 1332   HGB 10.7 (L) 05/13/2022 1332   HCT 31.9 (L) 05/13/2022 1332   PLT 118 (L) 05/13/2022 1332   MCV 87.9 05/13/2022 1332   MCH 29.5  05/13/2022 1332   MCHC 33.5 05/13/2022 1332   RDW 19.4 (H) 05/13/2022 1332    Hgb A1C Lab Results  Component Value Date   HGBA1C 5.5 05/13/2022            Assessment & Plan:     RTC in 3 months for follow-up of chronic conditions Webb Silversmith, NP

## 2022-08-07 ENCOUNTER — Inpatient Hospital Stay
Admission: EM | Admit: 2022-08-07 | Discharge: 2022-08-22 | DRG: 597 | Disposition: E | Payer: BC Managed Care – PPO | Attending: Internal Medicine | Admitting: Internal Medicine

## 2022-08-07 ENCOUNTER — Encounter: Payer: Self-pay | Admitting: Emergency Medicine

## 2022-08-07 ENCOUNTER — Emergency Department: Payer: BC Managed Care – PPO

## 2022-08-07 ENCOUNTER — Other Ambulatory Visit: Payer: Self-pay

## 2022-08-07 DIAGNOSIS — J45909 Unspecified asthma, uncomplicated: Secondary | ICD-10-CM | POA: Diagnosis present

## 2022-08-07 DIAGNOSIS — J90 Pleural effusion, not elsewhere classified: Secondary | ICD-10-CM

## 2022-08-07 DIAGNOSIS — Z825 Family history of asthma and other chronic lower respiratory diseases: Secondary | ICD-10-CM

## 2022-08-07 DIAGNOSIS — Z6825 Body mass index (BMI) 25.0-25.9, adult: Secondary | ICD-10-CM

## 2022-08-07 DIAGNOSIS — C7802 Secondary malignant neoplasm of left lung: Secondary | ICD-10-CM | POA: Diagnosis present

## 2022-08-07 DIAGNOSIS — N390 Urinary tract infection, site not specified: Secondary | ICD-10-CM

## 2022-08-07 DIAGNOSIS — C50919 Malignant neoplasm of unspecified site of unspecified female breast: Secondary | ICD-10-CM

## 2022-08-07 DIAGNOSIS — I10 Essential (primary) hypertension: Secondary | ICD-10-CM

## 2022-08-07 DIAGNOSIS — D539 Nutritional anemia, unspecified: Secondary | ICD-10-CM

## 2022-08-07 DIAGNOSIS — J910 Malignant pleural effusion: Secondary | ICD-10-CM | POA: Diagnosis present

## 2022-08-07 DIAGNOSIS — J9601 Acute respiratory failure with hypoxia: Secondary | ICD-10-CM | POA: Diagnosis present

## 2022-08-07 DIAGNOSIS — C7801 Secondary malignant neoplasm of right lung: Secondary | ICD-10-CM | POA: Diagnosis present

## 2022-08-07 DIAGNOSIS — R946 Abnormal results of thyroid function studies: Secondary | ICD-10-CM | POA: Diagnosis present

## 2022-08-07 DIAGNOSIS — Z885 Allergy status to narcotic agent status: Secondary | ICD-10-CM

## 2022-08-07 DIAGNOSIS — Z90710 Acquired absence of both cervix and uterus: Secondary | ICD-10-CM

## 2022-08-07 DIAGNOSIS — C50911 Malignant neoplasm of unspecified site of right female breast: Secondary | ICD-10-CM | POA: Diagnosis present

## 2022-08-07 DIAGNOSIS — Z515 Encounter for palliative care: Secondary | ICD-10-CM

## 2022-08-07 DIAGNOSIS — Z8261 Family history of arthritis: Secondary | ICD-10-CM

## 2022-08-07 DIAGNOSIS — R531 Weakness: Secondary | ICD-10-CM | POA: Diagnosis not present

## 2022-08-07 DIAGNOSIS — Z20822 Contact with and (suspected) exposure to covid-19: Secondary | ICD-10-CM | POA: Diagnosis present

## 2022-08-07 DIAGNOSIS — Z8249 Family history of ischemic heart disease and other diseases of the circulatory system: Secondary | ICD-10-CM

## 2022-08-07 DIAGNOSIS — C7931 Secondary malignant neoplasm of brain: Secondary | ICD-10-CM | POA: Diagnosis present

## 2022-08-07 DIAGNOSIS — M160 Bilateral primary osteoarthritis of hip: Secondary | ICD-10-CM | POA: Diagnosis present

## 2022-08-07 DIAGNOSIS — C50912 Malignant neoplasm of unspecified site of left female breast: Secondary | ICD-10-CM | POA: Diagnosis not present

## 2022-08-07 DIAGNOSIS — R918 Other nonspecific abnormal finding of lung field: Secondary | ICD-10-CM

## 2022-08-07 DIAGNOSIS — Z66 Do not resuscitate: Secondary | ICD-10-CM | POA: Diagnosis not present

## 2022-08-07 DIAGNOSIS — Z7951 Long term (current) use of inhaled steroids: Secondary | ICD-10-CM

## 2022-08-07 DIAGNOSIS — R634 Abnormal weight loss: Secondary | ICD-10-CM | POA: Diagnosis present

## 2022-08-07 DIAGNOSIS — C7951 Secondary malignant neoplasm of bone: Secondary | ICD-10-CM | POA: Diagnosis present

## 2022-08-07 DIAGNOSIS — R7989 Other specified abnormal findings of blood chemistry: Secondary | ICD-10-CM

## 2022-08-07 DIAGNOSIS — Z79899 Other long term (current) drug therapy: Secondary | ICD-10-CM

## 2022-08-07 DIAGNOSIS — R000 Tachycardia, unspecified: Secondary | ICD-10-CM | POA: Diagnosis present

## 2022-08-07 LAB — CBC
HCT: 37.2 % (ref 36.0–46.0)
Hemoglobin: 11.1 g/dL — ABNORMAL LOW (ref 12.0–15.0)
MCH: 30.7 pg (ref 26.0–34.0)
MCHC: 29.8 g/dL — ABNORMAL LOW (ref 30.0–36.0)
MCV: 103 fL — ABNORMAL HIGH (ref 80.0–100.0)
Platelets: 188 10*3/uL (ref 150–400)
RBC: 3.61 MIL/uL — ABNORMAL LOW (ref 3.87–5.11)
RDW: 20.3 % — ABNORMAL HIGH (ref 11.5–15.5)
WBC: 5.8 10*3/uL (ref 4.0–10.5)
nRBC: 12.4 % — ABNORMAL HIGH (ref 0.0–0.2)

## 2022-08-07 LAB — BASIC METABOLIC PANEL
Anion gap: 16 — ABNORMAL HIGH (ref 5–15)
BUN: 36 mg/dL — ABNORMAL HIGH (ref 8–23)
CO2: 25 mmol/L (ref 22–32)
Calcium: 11.4 mg/dL — ABNORMAL HIGH (ref 8.9–10.3)
Chloride: 101 mmol/L (ref 98–111)
Creatinine, Ser: 0.98 mg/dL (ref 0.44–1.00)
GFR, Estimated: >60 mL/min (ref >60)
Glucose, Bld: 84 mg/dL (ref 70–99)
Potassium: 3.6 mmol/L (ref 3.5–5.1)
Sodium: 142 mmol/L (ref 135–145)

## 2022-08-07 MED ORDER — IOHEXOL 350 MG/ML SOLN
100.0000 mL | Freq: Once | INTRAVENOUS | Status: AC | PRN
  Administered 2022-08-07: 75 mL via INTRAVENOUS

## 2022-08-07 MED ORDER — SODIUM CHLORIDE 0.9 % IV BOLUS (SEPSIS)
1000.0000 mL | Freq: Once | INTRAVENOUS | Status: AC
  Administered 2022-08-08: 1000 mL via INTRAVENOUS

## 2022-08-07 NOTE — ED Triage Notes (Signed)
Pt via POV for eval of ongoing weakness x 7 months which started after cracking some ribs and losing 70 pounds earlier this year. SInce then, her weakness has gotten progressively worse and she has had to start using assistive devices for ambulation and ADLs, new from baseline at the beginning of the year. Pt denies pain but reports persistent cough.

## 2022-08-07 NOTE — ED Provider Notes (Incomplete)
[facility] Provider Note    Event Date/Time   First MD Initiated Contact with Patient 08/01/2022 2303     (approximate)   History   Weakness   HPI  Caitlyn Robertson is a 61 y.o. female with history of hypertension who presents to the emergency department with her boyfriend for complaints of 7 months of generalized weakness, decreased oral intake due to nausea, 70 pound unintentional weight loss and a month of cough and shortness of breath.  States she has been seen by her outpatient provider.  They encouraged her to have a mammogram, colonoscopy.  Patient has not done either of these yet.  She did have an outpatient right upper quadrant abdominal ultrasound that was concerning for increased echogenicity of her liver and an MRI of her abdomen has been ordered as an outpatient.  She denies any fevers, vomiting, diarrhea.  Reports that she has been losing her hair.  She also reports that she has had "places on my scalp" that "nobody can tell me what they are".  No known history of cancer or HIV.  She is hypoxic here at 82% on room air.  She does not wear oxygen chronically.  States she talk to her brother who is a paramedic who recommended she come to the ED.     History provided by patient and significant other.    Past Medical History:  Diagnosis Date   Allergy    Arthritis    Asthma    History of chicken pox    Hypertension     Past Surgical History:  Procedure Laterality Date   ABDOMINAL HYSTERECTOMY  2000   partial   BACK SURGERY  1973   excision of tumor lumbar region   CESAREAN SECTION     DILATION AND CURETTAGE OF UTERUS     TONSILLECTOMY AND ADENOIDECTOMY      MEDICATIONS:  Prior to Admission medications   Medication Sig Start Date End Date Taking? Authorizing Provider  acetaminophen (TYLENOL) 500 MG tablet Take 500 mg by mouth as needed.    [provider]  albuterol (VENTOLIN HFA) 108 (90 Base) MCG/ACT inhaler Inhale 1-2 puffs  into the lungs every 4 (four) hours as needed for wheezing or shortness of breath. 06/25/22   Jearld Fenton, NP  benzonatate (TESSALON) 200 MG capsule Take 1 capsule (200 mg total) by mouth 3 (three) times daily as needed for cough. 05/18/22   Jearld Fenton, NP  budesonide (PULMICORT FLEXHALER) 180 MCG/ACT inhaler Inhale 1 puff into the lungs in the morning and at bedtime. 07/13/22   Jearld Fenton, NP  fluticasone (FLONASE) 50 MCG/ACT nasal spray Place into both nostrils daily.    [provider]  levocetirizine (XYZAL) 5 MG tablet Take 1 tablet (5 mg total) by mouth every evening. 04/21/22   Parks Ranger, Devonne Doughty, DO  lisinopril (ZESTRIL) 40 MG tablet TAKE 1 TABLET BY MOUTH EVERY DAY 07/12/22   Jearld Fenton, NP  montelukast (SINGULAIR) 10 MG tablet TAKE 1 TABLET BY MOUTH EVERYDAY AT BEDTIME 07/12/22   Jearld Fenton, NP    Physical Exam   Triage Vital Signs: ED Triage Vitals  Enc Vitals Group     BP 08/17/2022 2127 133/85     Pulse Rate 07/28/2022 2127 (!) 103     Resp 07/29/2022 2127 18     Temp 08/21/2022 2127 97.7 F (36.5 C)     Temp Source 08/06/2022 2127 Oral  SpO2 08/02/2022 2127 96 %     Weight 07/30/2022 2128 160 lb (72.6 kg)     Height 08/02/2022 2128 '5\' 5"'$  (1.651 m)     Head Circumference --      Peak Flow --      Pain Score 08/06/2022 2128 0     Pain Loc --      Pain Edu? --      Excl. in [place]? --     Most recent vital signs: Vitals:   08/08/22 0030 08/08/22 0100  BP: (!) 163/86 (!) 151/86  Pulse: 97 98  Resp: 17 (!) 26  Temp:    SpO2: 94% 96%    CONSTITUTIONAL: Alert and oriented and responds appropriately to questions.  Thin, appears malnourished and older than stated age HEAD: Normocephalic, atraumatic EYES: Conjunctivae clear, pupils appear equal, sclera nonicteric ENT: normal nose; dry appearing mucous membranes NECK: Supple, normal ROM CARD: RRR; S1 and S2 appreciated; no murmurs, no clicks, no rubs, no gallops RESP: Slightly tachypneic but no  respiratory distress.  Diminished aeration at bases bilaterally.  No rhonchi, rales or wheezing.  Sats initially 82% on room air on arrival.  Placed on 3 L nasal cannula.  I have turned off her oxygen and she quickly drops to 87%. ABD/GI: Normal bowel sounds; non-distended; soft, non-tender, no rebound, no guarding, no peritoneal signs BACK: The back appears normal EXT: Normal ROM in all joints; no deformity noted, no edema; no cyanosis no calf tenderness or calf swelling SKIN: Normal color for age and race; warm; no rash on exposed skin NEURO: Moves all extremities equally, normal speech PSYCH: The patient's mood and manner are appropriate.   ED Results / Procedures / Treatments   LABS: (all labs ordered are listed, but only abnormal results are displayed) Labs Reviewed  CBC - Abnormal; Notable for the following components:      Result Value   RBC 3.61 (*)    Hemoglobin 11.1 (*)    MCV 103.0 (*)    MCHC 29.8 (*)    RDW 20.3 (*)    nRBC 12.4 (*)    All other components within normal limits  URINALYSIS, ROUTINE W REFLEX MICROSCOPIC - Abnormal; Notable for the following components:   Color, Urine YELLOW (*)    APPearance CLOUDY (*)    Specific Gravity, Urine >1.046 (*)    Ketones, ur 20 (*)    Protein, ur 30 (*)    Leukocytes,Ua LARGE (*)    WBC, UA >50 (*)    Bacteria, UA RARE (*)    All other components within normal limits  BASIC METABOLIC PANEL - Abnormal; Notable for the following components:   BUN 36 (*)    Calcium 11.4 (*)    Anion gap 16 (*)    All other components within normal limits  HEPATIC FUNCTION PANEL - Abnormal; Notable for the following components:   AST 157 (*)    Total Bilirubin 1.4 (*)    Bilirubin, Direct 0.3 (*)    Indirect Bilirubin 1.1 (*)    All other components within normal limits  TSH - Abnormal; Notable for the following components:   TSH 6.239 (*)    All other components within normal limits  BRAIN NATRIURETIC PEPTIDE - Abnormal; Notable for  the following components:   B Natriuretic Peptide 114.2 (*)    All other components within normal limits  TROPONIN I (HIGH SENSITIVITY) - Abnormal; Notable for the following components:   Troponin I (High Sensitivity) 57 (*)  All other components within normal limits  SARS CORONAVIRUS 2 BY RT PCR  URINE CULTURE  LIPASE, BLOOD  PROCALCITONIN  URINE DRUG SCREEN, QUALITATIVE ([facility] ONLY)  MAGNESIUM  T4, FREE  CBG MONITORING, ED     EKG:  EKG Interpretation  Date/Time:  Saturday August 07 2022 21:37:23 EDT Ventricular Rate:  104 PR Interval:  136 QRS Duration: 86 QT Interval:  332 QTC Calculation: 436 R Axis:   54 Text Interpretation: Sinus tachycardia Cannot rule out Anterior infarct , age undetermined Abnormal ECG No previous ECGs available Confirmed by Pryor Curia [phone]) on 08/11/2022 11:13:53 PM         RADIOLOGY: My personal review and interpretation of imaging: CT head shows small right-sided subdural without midline shift.  Patient has vasogenic edema without midline shift likely indicating metastatic disease within the brain.  CTA chest shows no PE but does show diffuse lymphadenopathy, pulmonary nodules, perihilar mass and likely bilateral breast cancer.  She has diffuse skeletal disease.  I have personally reviewed all radiology reports.   CT HEAD WO CONTRAST (5MM)  Result Date: 08/08/2022 CLINICAL DATA:  Metastatic disease EXAM: CT HEAD WITHOUT CONTRAST TECHNIQUE: Contiguous axial images were obtained from the base of the skull through the vertex without intravenous contrast. RADIATION DOSE REDUCTION: This exam was performed according to the departmental dose-optimization program which includes automated exposure control, adjustment of the mA and/or kV according to patient size and/or use of iterative reconstruction technique. FINDINGS: Brain: There is a thin subdural hematoma over the right convexity, 6 mm. There is hypoattenuation within both anterior frontal  lobes and the anterior right temporal lobe. No midline shift or other mass effect. Vascular: No abnormal hyperdensity of the major intracranial arteries or dural venous sinuses. No intracranial atherosclerosis. Skull: The visualized skull base, calvarium and extracranial soft tissues are normal. Sinuses/Orbits: Opacification of the left maxillary sinus. The orbits are normal. IMPRESSION: 1. Thin subdural hematoma over the right convexity, 6 mm in thickness. No midline shift or other mass effect. 2. Hypoattenuation within both anterior frontal lobes and the anterior right temporal lobe, favored to be secondary to vasogenic edema and possibly indicating the presence of metastatic disease. MRI with and without contrast is recommended for further characterization. Critical Value/emergent results were called by telephone at the time of interpretation on 08/08/2022 at 12:45 am to provider [facility] , who verbally acknowledged these results. Electronically Signed   By: Ulyses Jarred M.D.   On: 08/08/2022 00:45   CT Angio Chest PE W and/or Wo Contrast  Result Date: 08/08/2022 CLINICAL DATA:  Weakness for several months and weight loss, initial encounter EXAM: CT ANGIOGRAPHY CHEST CT ABDOMEN AND PELVIS WITH CONTRAST TECHNIQUE: Multidetector CT imaging of the chest was performed using the standard protocol during bolus administration of intravenous contrast. Multiplanar CT image reconstructions and MIPs were obtained to evaluate the vascular anatomy. Multidetector CT imaging of the abdomen and pelvis was performed using the standard protocol during bolus administration of intravenous contrast. RADIATION DOSE REDUCTION: This exam was performed according to the departmental dose-optimization program which includes automated exposure control, adjustment of the mA and/or kV according to patient size and/or use of iterative reconstruction technique. CONTRAST:  92m OMNIPAQUE IOHEXOL 350 MG/ML SOLN COMPARISON:  None  Available. FINDINGS: CTA CHEST FINDINGS Cardiovascular: Thoracic aorta demonstrates a normal branching pattern. No aneurysmal dilatation or dissection is noted. Heart is mildly enlarged in size. No coronary calcifications are seen. The pulmonary artery shows a normal branching pattern bilaterally without  filling defect to suggest pulmonary embolism. Mediastinum/Nodes: Thoracic inlet is within normal limits. Prominent axillary nodes are noted bilaterally measuring up to 11 mm in short axis on the right and 17 mm in short axis on the left. The left pectoralis minor muscle is somewhat nodular likely representing some associated adenopathy. Some adenopathy measuring up to 1 cm noted the left retropectoral region as well. Scattered lymph nodes are noted in the mediastinum measuring up to 11 mm in short axis along the aorta. Small hilar nodes are noted as well. The esophagus appears within limits. Lungs/Pleura: Large bilateral pleural effusions are noted left slightly greater than right. A 1.6 by 0.9 cm nodule is noted in the medial aspect of the left upper lobe consistent with metastatic disease. Left lower lobe consolidation is identified. The right lung demonstrates a nodule in the upper lobe measuring approximately 9 mm in greatest dimension. A few scattered smaller nodules are noted in the right upper lobe as well as a masslike density in the right perihilar region which measures approximately 4.7 by 3.2 cm. A portion of this may be related to consolidation as well. Musculoskeletal: Bony structures of the chest demonstrate mottled sclerosis throughout the thoracic vertebral bodies as well as multiple small lytic lesions throughout the ribcage. Similar findings are noted within the sternum. Multiple healed rib fractures are noted bilaterally likely pathologic in nature. No compression deformity is seen. Lytic lesion is noted in the posterior elements of T10 on the right. The breasts are dense bilaterally. Diffuse skin  thickening is noted over the left breast incompletely evaluated. 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 Further workup is recommended. Additionally there is a soft tissue mass in the upper portion of the right breast which measures at least 3 cm in greatest dimension also suspicious for breast carcinoma. Review of the MIP images confirms the above findings. CT ABDOMEN and PELVIS FINDINGS Hepatobiliary: No focal liver abnormality is seen. No gallstones, gallbladder wall thickening, or biliary dilatation. Pancreas: Unremarkable. No pancreatic ductal dilatation or surrounding inflammatory changes. Spleen: Normal in size without focal abnormality. Adrenals/Urinary Tract: Adrenal glands are within normal limits bilaterally. Kidneys demonstrate a normal enhancement pattern. No renal calculi or obstructive changes are noted. Normal excretion is noted on delayed images. The bladder is decompressed. Stomach/Bowel: The appendix is within normal limits. No obstructive or inflammatory changes colon seen. Small bowel and stomach are unremarkable. Vascular/Lymphatic: No significant vascular findings are present. No enlarged abdominal or pelvic lymph nodes. Reproductive: Status post hysterectomy. No adnexal masses. Other: No free fluid is noted.  No herniation is seen. Musculoskeletal: Bilateral hip replacements are noted. Mottled sclerosis is noted throughout the visualized bony structures the abdomen pelvis similar to that seen chest again consistent with metastatic disease. No compression deformities are noted. Postsurgical fusion is noted in the lumbar spine posteriorly. Review of the MIP images confirms the above findings. IMPRESSION: CTA of the chest: No evidence of pulmonary emboli. Constellation of findings consistent with breast carcinoma( possibly bilaterally) with diffuse skeletal metastatic disease, bilateral axillary and left retropectoral adenopathy, bilateral pleural effusions and as well  as bilateral parenchymal lung nodules and a right perihilar mass lesion. Further workup of the breast abnormalities is recommended. CT of the abdomen and pelvis: Diffuse skeletal metastatic disease. No other focal abnormality is noted. Electronically Signed   By: Inez Catalina M.D.   On: 08/08/2022 00:41   CT ABDOMEN PELVIS W CONTRAST  Result Date: 08/08/2022 CLINICAL DATA:  Weakness for several months and  weight loss, initial encounter EXAM: CT ANGIOGRAPHY CHEST CT ABDOMEN AND PELVIS WITH CONTRAST TECHNIQUE: Multidetector CT imaging of the chest was performed using the standard protocol during bolus administration of intravenous contrast. Multiplanar CT image reconstructions and MIPs were obtained to evaluate the vascular anatomy. Multidetector CT imaging of the abdomen and pelvis was performed using the standard protocol during bolus administration of intravenous contrast. RADIATION DOSE REDUCTION: This exam was performed according to the departmental dose-optimization program which includes automated exposure control, adjustment of the mA and/or kV according to patient size and/or use of iterative reconstruction technique. CONTRAST:  39m OMNIPAQUE IOHEXOL 350 MG/ML SOLN COMPARISON:  None Available. FINDINGS: CTA CHEST FINDINGS Cardiovascular: Thoracic aorta demonstrates a normal branching pattern. No aneurysmal dilatation or dissection is noted. Heart is mildly enlarged in size. No coronary calcifications are seen. The pulmonary artery shows a normal branching pattern bilaterally without filling defect to suggest pulmonary embolism. Mediastinum/Nodes: Thoracic inlet is within normal limits. Prominent axillary nodes are noted bilaterally measuring up to 11 mm in short axis on the right and 17 mm in short axis on the left. The left pectoralis minor muscle is somewhat nodular likely representing some associated adenopathy. Some adenopathy measuring up to 1 cm noted the left retropectoral region as well.  Scattered lymph nodes are noted in the mediastinum measuring up to 11 mm in short axis along the aorta. Small hilar nodes are noted as well. The esophagus appears within limits. Lungs/Pleura: Large bilateral pleural effusions are noted left slightly greater than right. A 1.6 by 0.9 cm nodule is noted in the medial aspect of the left upper lobe consistent with metastatic disease. Left lower lobe consolidation is identified. The right lung demonstrates a nodule in the upper lobe measuring approximately 9 mm in greatest dimension. A few scattered smaller nodules are noted in the right upper lobe as well as a masslike density in the right perihilar region which measures approximately 4.7 by 3.2 cm. A portion of this may be related to consolidation as well. Musculoskeletal: Bony structures of the chest demonstrate mottled sclerosis throughout the thoracic vertebral bodies as well as multiple small lytic lesions throughout the ribcage. Similar findings are noted within the sternum. Multiple healed rib fractures are noted bilaterally likely pathologic in nature. No compression deformity is seen. Lytic lesion is noted in the posterior elements of T10 on the right. The breasts are dense bilaterally. Diffuse skin thickening is noted over the left breast incompletely evaluated. 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 Further workup is recommended. Additionally there is a soft tissue mass in the upper portion of the right breast which measures at least 3 cm in greatest dimension also suspicious for breast carcinoma. Review of the MIP images confirms the above findings. CT ABDOMEN and PELVIS FINDINGS Hepatobiliary: No focal liver abnormality is seen. No gallstones, gallbladder wall thickening, or biliary dilatation. Pancreas: Unremarkable. No pancreatic ductal dilatation or surrounding inflammatory changes. Spleen: Normal in size without focal abnormality. Adrenals/Urinary Tract: Adrenal glands are  within normal limits bilaterally. Kidneys demonstrate a normal enhancement pattern. No renal calculi or obstructive changes are noted. Normal excretion is noted on delayed images. The bladder is decompressed. Stomach/Bowel: The appendix is within normal limits. No obstructive or inflammatory changes colon seen. Small bowel and stomach are unremarkable. Vascular/Lymphatic: No significant vascular findings are present. No enlarged abdominal or pelvic lymph nodes. Reproductive: Status post hysterectomy. No adnexal masses. Other: No free fluid is noted.  No herniation is seen. Musculoskeletal: Bilateral hip  replacements are noted. Mottled sclerosis is noted throughout the visualized bony structures the abdomen pelvis similar to that seen chest again consistent with metastatic disease. No compression deformities are noted. Postsurgical fusion is noted in the lumbar spine posteriorly. Review of the MIP images confirms the above findings. IMPRESSION: CTA of the chest: No evidence of pulmonary emboli. Constellation of findings consistent with breast carcinoma( possibly bilaterally) with diffuse skeletal metastatic disease, bilateral axillary and left retropectoral adenopathy, bilateral pleural effusions and as well as bilateral parenchymal lung nodules and a right perihilar mass lesion. Further workup of the breast abnormalities is recommended. CT of the abdomen and pelvis: Diffuse skeletal metastatic disease. No other focal abnormality is noted. Electronically Signed   By: Inez Catalina M.D.   On: 08/08/2022 00:41     PROCEDURES:  Critical Care performed: Yes, see critical care procedure note(s)   CRITICAL CARE Performed by: Cyril Mourning Demisha Nokes   Total critical care time: 50 minutes  Critical care time was exclusive of separately billable procedures and treating other patients.  Critical care was necessary to treat or prevent imminent or life-threatening deterioration.  Critical care was time spent personally by  me on the following activities: development of treatment plan with patient and/or surrogate as well as nursing, discussions with consultants, evaluation of patient's response to treatment, examination of patient, obtaining history from patient or surrogate, ordering and performing treatments and interventions, ordering and review of laboratory studies, ordering and review of radiographic studies, pulse oximetry and re-evaluation of patient's condition.   Marland Kitchen1-3 Lead EKG Interpretation  Performed by: Nain Rudd, Delice Bison, DO Authorized by: Alysha Doolan, Delice Bison, DO     Interpretation: normal     ECG rate:  98   ECG rate assessment: normal     Rhythm: sinus rhythm     Ectopy: none     Conduction: normal       IMPRESSION / MDM / ASSESSMENT AND PLAN / ED COURSE  I reviewed the triage vital signs and the nursing notes.    Patient here with complaints of nausea, decreased appetite, unexplained weight loss.  Also having cough and shortness of breath.  Hypoxic here.  The patient is on the cardiac monitor to evaluate for evidence of arrhythmia and/or significant heart rate changes.   DIFFERENTIAL DIAGNOSIS (includes but not limited to):   Metastatic disease, HIV, infectious etiology, sepsis, PE, pneumonia, pneumothorax, CHF, ACS   Patient's presentation is most consistent with acute presentation with potential threat to life or bodily function.   PLAN: Discussed with patient that I am concerned for metastatic disease given her presentation.  Recommend CT of the head, CTA chest, CT abdomen pelvis.  We will obtain CBC, CMP, lipase, troponin, procalcitonin, urinalysis, TSH, EKG.  Will give IV fluids.  Given her new hypoxia, patient will need admission.   MEDICATIONS GIVEN IN ED: Medications  sodium chloride 0.9 % bolus 1,000 mL (1,000 mLs Intravenous New Bag/Given 08/08/22 0121)  iohexol (OMNIPAQUE) 350 MG/ML injection 100 mL (75 mLs Intravenous Contrast Given 08/04/2022 2354)  cefTRIAXone (ROCEPHIN) 1 g  in sodium chloride 0.9 % 100 mL IVPB (0 g Intravenous Hold 08/08/22 0130)  dexamethasone (DECADRON) injection 10 mg (10 mg Intravenous Given 08/08/22 0121)  LORazepam (ATIVAN) injection 1 mg (1 mg Intravenous Given 08/08/22 0128)  gadobutrol (GADAVIST) 1 MMOL/ML injection 7 mL (7 mLs Intravenous Contrast Given 08/08/22 0203)     ED COURSE: Patient CT scans reviewed and interpreted by myself and the radiologist.  CT head shows a  thin right-sided subdural with no midline shift.  She is not on blood thinners and has no head injury, headache or neurologic deficits.  She also has vasogenic edema likely indicating metastatic disease.  Will obtain MRI of the brain with and without contrast and give Decadron.  CT of the chest, abdomen pelvis reviewed and interpreted by myself and the radiologist and shows no PE but she does have bilateral pleural effusions.  She also has findings consistent with likely bilateral breast cancer with diffuse skeletal metastatic disease, bilateral axillary and retropectoral adenopathy, bilateral pleural effusions and bilateral lung nodules and a right perihilar mass.  Labs show mild hypercalcemia.  Procalcitonin mildly elevated 0.42.  Troponin 57.  TSH elevated but free T4 normal.  Urine appears infected.  Culture pending.  Will give Rocephin and IV fluids.  Will discuss with hospitalist for admission.  Patient and significant other updated at bedside regarding these results and need for further work-up, admission due to her hypoxia.   CONSULTS:  Consulted and discussed patient's case with hospitalist, Dr. Damita Dunnings.  I have recommended admission and consulting physician agrees and will place admission orders.  Patient (and family if present) agree with this plan.   I reviewed all nursing notes, vitals, pertinent previous records.  All labs, EKGs, imaging ordered have been independently reviewed and interpreted by myself.    OUTSIDE RECORDS REVIEWED: Reviewed patient's multiple  recent PCP notes in June - August 2023.       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   Yahya Boldman, Delice Bison, DO 08/08/22 0215    Shanikka Wonders, Delice Bison, DO 08/08/22 [phone]

## 2022-08-08 ENCOUNTER — Emergency Department: Payer: BC Managed Care – PPO

## 2022-08-08 ENCOUNTER — Encounter: Payer: Self-pay | Admitting: Radiology

## 2022-08-08 DIAGNOSIS — J9601 Acute respiratory failure with hypoxia: Secondary | ICD-10-CM

## 2022-08-08 DIAGNOSIS — R531 Weakness: Secondary | ICD-10-CM | POA: Diagnosis present

## 2022-08-08 DIAGNOSIS — Z90710 Acquired absence of both cervix and uterus: Secondary | ICD-10-CM | POA: Diagnosis not present

## 2022-08-08 DIAGNOSIS — Z20822 Contact with and (suspected) exposure to covid-19: Secondary | ICD-10-CM | POA: Diagnosis present

## 2022-08-08 DIAGNOSIS — D539 Nutritional anemia, unspecified: Secondary | ICD-10-CM | POA: Diagnosis present

## 2022-08-08 DIAGNOSIS — C7951 Secondary malignant neoplasm of bone: Secondary | ICD-10-CM | POA: Diagnosis present

## 2022-08-08 DIAGNOSIS — Z6825 Body mass index (BMI) 25.0-25.9, adult: Secondary | ICD-10-CM | POA: Diagnosis not present

## 2022-08-08 DIAGNOSIS — R000 Tachycardia, unspecified: Secondary | ICD-10-CM | POA: Diagnosis present

## 2022-08-08 DIAGNOSIS — I10 Essential (primary) hypertension: Secondary | ICD-10-CM | POA: Diagnosis present

## 2022-08-08 DIAGNOSIS — J45909 Unspecified asthma, uncomplicated: Secondary | ICD-10-CM | POA: Diagnosis present

## 2022-08-08 DIAGNOSIS — C7931 Secondary malignant neoplasm of brain: Secondary | ICD-10-CM | POA: Diagnosis present

## 2022-08-08 DIAGNOSIS — Z8249 Family history of ischemic heart disease and other diseases of the circulatory system: Secondary | ICD-10-CM | POA: Diagnosis not present

## 2022-08-08 DIAGNOSIS — J910 Malignant pleural effusion: Secondary | ICD-10-CM | POA: Diagnosis present

## 2022-08-08 DIAGNOSIS — Z515 Encounter for palliative care: Secondary | ICD-10-CM | POA: Diagnosis not present

## 2022-08-08 DIAGNOSIS — Z66 Do not resuscitate: Secondary | ICD-10-CM | POA: Diagnosis not present

## 2022-08-08 DIAGNOSIS — C50911 Malignant neoplasm of unspecified site of right female breast: Secondary | ICD-10-CM | POA: Diagnosis present

## 2022-08-08 DIAGNOSIS — C7801 Secondary malignant neoplasm of right lung: Secondary | ICD-10-CM | POA: Diagnosis present

## 2022-08-08 DIAGNOSIS — R634 Abnormal weight loss: Secondary | ICD-10-CM | POA: Diagnosis present

## 2022-08-08 DIAGNOSIS — M160 Bilateral primary osteoarthritis of hip: Secondary | ICD-10-CM | POA: Diagnosis present

## 2022-08-08 DIAGNOSIS — C7802 Secondary malignant neoplasm of left lung: Secondary | ICD-10-CM | POA: Diagnosis present

## 2022-08-08 DIAGNOSIS — C50919 Malignant neoplasm of unspecified site of unspecified female breast: Secondary | ICD-10-CM | POA: Diagnosis not present

## 2022-08-08 DIAGNOSIS — R7989 Other specified abnormal findings of blood chemistry: Secondary | ICD-10-CM | POA: Diagnosis present

## 2022-08-08 DIAGNOSIS — C50912 Malignant neoplasm of unspecified site of left female breast: Secondary | ICD-10-CM | POA: Diagnosis present

## 2022-08-08 DIAGNOSIS — Z825 Family history of asthma and other chronic lower respiratory diseases: Secondary | ICD-10-CM | POA: Diagnosis not present

## 2022-08-08 DIAGNOSIS — R946 Abnormal results of thyroid function studies: Secondary | ICD-10-CM | POA: Diagnosis present

## 2022-08-08 DIAGNOSIS — Z8261 Family history of arthritis: Secondary | ICD-10-CM | POA: Diagnosis not present

## 2022-08-08 LAB — T4, FREE: Free T4: 0.69 ng/dL (ref 0.61–1.12)

## 2022-08-08 LAB — URINALYSIS, ROUTINE W REFLEX MICROSCOPIC
Bilirubin Urine: NEGATIVE
Glucose, UA: NEGATIVE mg/dL
Hgb urine dipstick: NEGATIVE
Ketones, ur: 20 mg/dL — AB
Nitrite: NEGATIVE
Protein, ur: 30 mg/dL — AB
Specific Gravity, Urine: >1.046 — ABNORMAL HIGH (ref 1.005–1.030)
WBC, UA: >50 WBC/hpf — ABNORMAL HIGH (ref 0–5)
pH: 5 (ref 5.0–8.0)

## 2022-08-08 LAB — HEPATIC FUNCTION PANEL
ALT: 36 U/L (ref 0–44)
AST: 157 U/L — ABNORMAL HIGH (ref 15–41)
Albumin: 3.6 g/dL (ref 3.5–5.0)
Alkaline Phosphatase: 119 U/L (ref 38–126)
Bilirubin, Direct: 0.3 mg/dL — ABNORMAL HIGH (ref 0.0–0.2)
Indirect Bilirubin: 1.1 mg/dL — ABNORMAL HIGH (ref 0.3–0.9)
Total Bilirubin: 1.4 mg/dL — ABNORMAL HIGH (ref 0.3–1.2)
Total Protein: 7.3 g/dL (ref 6.5–8.1)

## 2022-08-08 LAB — URINE DRUG SCREEN, QUALITATIVE (ARMC ONLY)
Amphetamines, Ur Screen: NOT DETECTED
Barbiturates, Ur Screen: NOT DETECTED
Benzodiazepine, Ur Scrn: NOT DETECTED
Cannabinoid 50 Ng, Ur ~~LOC~~: NOT DETECTED
Cocaine Metabolite,Ur ~~LOC~~: NOT DETECTED
MDMA (Ecstasy)Ur Screen: NOT DETECTED
Methadone Scn, Ur: NOT DETECTED
Opiate, Ur Screen: NOT DETECTED
Phencyclidine (PCP) Ur S: NOT DETECTED
Tricyclic, Ur Screen: NOT DETECTED

## 2022-08-08 LAB — TSH: TSH: 6.239 u[IU]/mL — ABNORMAL HIGH (ref 0.350–4.500)

## 2022-08-08 LAB — MAGNESIUM: Magnesium: 2.1 mg/dL (ref 1.7–2.4)

## 2022-08-08 LAB — PROCALCITONIN: Procalcitonin: 0.42 ng/mL

## 2022-08-08 LAB — SARS CORONAVIRUS 2 BY RT PCR: SARS Coronavirus 2 by RT PCR: NEGATIVE

## 2022-08-08 LAB — TROPONIN I (HIGH SENSITIVITY): Troponin I (High Sensitivity): 57 ng/L — ABNORMAL HIGH (ref <18)

## 2022-08-08 LAB — BRAIN NATRIURETIC PEPTIDE: B Natriuretic Peptide: 114.2 pg/mL — ABNORMAL HIGH (ref 0.0–100.0)

## 2022-08-08 LAB — LIPASE, BLOOD: Lipase: 36 U/L (ref 11–51)

## 2022-08-08 MED ORDER — DEXAMETHASONE SODIUM PHOSPHATE 4 MG/ML IJ SOLN
4.0000 mg | Freq: Three times a day (TID) | INTRAMUSCULAR | Status: DC
  Administered 2022-08-08 (×3): 4 mg via INTRAVENOUS
  Filled 2022-08-08 (×3): qty 1

## 2022-08-08 MED ORDER — LISINOPRIL 20 MG PO TABS
40.0000 mg | ORAL_TABLET | Freq: Every day | ORAL | Status: DC
  Administered 2022-08-08: 40 mg via ORAL
  Filled 2022-08-08: qty 2

## 2022-08-08 MED ORDER — ALBUTEROL SULFATE (2.5 MG/3ML) 0.083% IN NEBU: 2.5000 mg | INHALATION_SOLUTION | RESPIRATORY_TRACT | Status: DC | PRN

## 2022-08-08 MED ORDER — ONDANSETRON HCL 4 MG/2ML IJ SOLN: 4.0000 mg | Freq: Four times a day (QID) | INTRAMUSCULAR | Status: DC | PRN

## 2022-08-08 MED ORDER — SODIUM CHLORIDE 0.9 % IV SOLN
1.0000 g | Freq: Once | INTRAVENOUS | Status: AC
  Administered 2022-08-08: 1 g via INTRAVENOUS
  Filled 2022-08-08: qty 10

## 2022-08-08 MED ORDER — ACETAMINOPHEN 650 MG RE SUPP: 650.0000 mg | Freq: Four times a day (QID) | RECTAL | Status: DC | PRN

## 2022-08-08 MED ORDER — SODIUM CHLORIDE 0.9 % IV SOLN
500.0000 mg | INTRAVENOUS | Status: DC
  Administered 2022-08-08: 500 mg via INTRAVENOUS
  Filled 2022-08-08: qty 5

## 2022-08-08 MED ORDER — ACETAMINOPHEN 325 MG PO TABS: 650.0000 mg | ORAL_TABLET | Freq: Four times a day (QID) | ORAL | Status: DC | PRN

## 2022-08-08 MED ORDER — HALOPERIDOL LACTATE 2 MG/ML PO CONC
0.5000 mg | ORAL | Status: DC | PRN
  Filled 2022-08-08: qty 5

## 2022-08-08 MED ORDER — GLYCOPYRROLATE 0.2 MG/ML IJ SOLN: 0.2000 mg | INTRAMUSCULAR | Status: DC | PRN

## 2022-08-08 MED ORDER — ORAL CARE MOUTH RINSE: 15.0000 mL | OROMUCOSAL | Status: DC | PRN

## 2022-08-08 MED ORDER — LORAZEPAM 2 MG/ML IJ SOLN
1.0000 mg | Freq: Once | INTRAMUSCULAR | Status: AC
  Administered 2022-08-08: 1 mg via INTRAVENOUS
  Filled 2022-08-08: qty 1

## 2022-08-08 MED ORDER — HYDROCODONE-ACETAMINOPHEN 5-325 MG PO TABS: 1.0000 | ORAL_TABLET | ORAL | Status: DC | PRN

## 2022-08-08 MED ORDER — BENZONATATE 100 MG PO CAPS: 200.0000 mg | ORAL_CAPSULE | Freq: Three times a day (TID) | ORAL | Status: DC | PRN

## 2022-08-08 MED ORDER — LEVETIRACETAM IN NACL 500 MG/100ML IV SOLN
500.0000 mg | Freq: Two times a day (BID) | INTRAVENOUS | Status: DC
  Administered 2022-08-08 (×2): 500 mg via INTRAVENOUS
  Filled 2022-08-08 (×3): qty 100

## 2022-08-08 MED ORDER — MORPHINE SULFATE (PF) 2 MG/ML IV SOLN: 2.0000 mg | INTRAVENOUS | Status: DC | PRN

## 2022-08-08 MED ORDER — POLYVINYL ALCOHOL 1.4 % OP SOLN: 1.0000 [drp] | Freq: Four times a day (QID) | OPHTHALMIC | Status: DC | PRN

## 2022-08-08 MED ORDER — BIOTENE DRY MOUTH MT LIQD: 15.0000 mL | OROMUCOSAL | Status: DC | PRN

## 2022-08-08 MED ORDER — GADOBUTROL 1 MMOL/ML IV SOLN
7.0000 mL | Freq: Once | INTRAVENOUS | Status: AC | PRN
  Administered 2022-08-08: 7 mL via INTRAVENOUS

## 2022-08-08 MED ORDER — ONDANSETRON HCL 4 MG PO TABS: 4.0000 mg | ORAL_TABLET | Freq: Four times a day (QID) | ORAL | Status: DC | PRN

## 2022-08-08 MED ORDER — SODIUM CHLORIDE 0.9 % IV SOLN: 2.0000 g | INTRAVENOUS | Status: DC

## 2022-08-08 MED ORDER — ONDANSETRON 4 MG PO TBDP: 4.0000 mg | ORAL_TABLET | Freq: Four times a day (QID) | ORAL | Status: DC | PRN

## 2022-08-08 MED ORDER — HALOPERIDOL LACTATE 5 MG/ML IJ SOLN: 0.5000 mg | INTRAMUSCULAR | Status: DC | PRN

## 2022-08-08 MED ORDER — SODIUM CHLORIDE 0.9 % IV SOLN
0.2500 mg/h | INTRAVENOUS | Status: DC
  Administered 2022-08-08: 0.25 mg/h via INTRAVENOUS
  Filled 2022-08-08: qty 5

## 2022-08-08 MED ORDER — SODIUM CHLORIDE 0.9 % IV SOLN: INTRAVENOUS | Status: DC

## 2022-08-08 MED ORDER — HALOPERIDOL 0.5 MG PO TABS: 0.5000 mg | ORAL_TABLET | ORAL | Status: DC | PRN

## 2022-08-08 MED ORDER — DEXAMETHASONE SODIUM PHOSPHATE 10 MG/ML IJ SOLN
10.0000 mg | Freq: Once | INTRAMUSCULAR | Status: AC
  Administered 2022-08-08: 10 mg via INTRAVENOUS
  Filled 2022-08-08: qty 1

## 2022-08-08 MED ORDER — GLYCOPYRROLATE 1 MG PO TABS: 1.0000 mg | ORAL_TABLET | ORAL | Status: DC | PRN

## 2022-08-08 NOTE — Assessment & Plan Note (Addendum)
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 Seizure prophylaxis with Keppra.   Neurochecks  consider neurosurgery consult in the am - Pain control - Oncology consult and recommend palliative care consult

## 2022-08-08 NOTE — Assessment & Plan Note (Signed)
Continue lisinopril

## 2022-08-08 NOTE — Assessment & Plan Note (Addendum)
Possibly related to metastatic disease Continue to monitor

## 2022-08-08 NOTE — Progress Notes (Signed)
PROGRESS NOTE    Caitlyn Robertson  BJS:[id number] DOB: [birth year] DOA: 08/20/2022 PCP: Jearld Fenton, NP   Brief Narrative:  Caitlyn Robertson is a 61 y.o. female with medical history significant for HTN who presents to the ED for evaluation of weakness that has been ongoing for the past 7 months associated with decreased oral intake, persistent nausea, cough, shortness of breath and a 70 pound unintentional weight loss.    ED course: Tachycardic to 103 with O2 sat 82% on room air improving to the mid 90s on 2 L and with otherwise normal vitals.  Labs significant for hemoglobin of 11.1 with MCV 103, calcium 11.4 with anion gap 16, troponin 57 with BNP 114 and TSH 6.23.  AST 157 with ALT 36 and total bili 1.4.  Lipase 36.  Pro-Calc 0.42.  Urinalysis with large leukocyte esterase, COVID-negative and UDS clean.  EKG, personally reviewed and interpreted showing sinus tachycardia at 104 with no acute ST-T wave changes.  CTA chest shows no PE, finding consistent with breast carcinoma possibly bilaterally with diffuse skeletal metastatic disease, bilateral axillary and left retropectoral adenopathy, bilateral pleural effusion as well as bilateral parenchymal lung nodules and right perihilar mass lesion.  CT abdomen/pelvis shows diffuse skeletal metastasis disease.  MRI brain shows diffuse dural metastasis disease with bulky is to areas of tumor at the anterior right convexity, along the falx cerebri and anterior to both temporal lobes.  No intracranial hemorrhage.  Edema within both anterior temporal lobes and both superior frontal lobes likely due to mass effect from the direct invasion by extra-axial masses.  Multiple calvarial metastasis.  Enhancing nodules of the scalp may also be metastasis.  Patient was given IV fluids, Decadron, Rocephin and hospitalist consulted for admission.  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  They would like to keep patient comfortable.  We discussed CODE STATUS-patient and her family wishes to be DNR. -Consulted palliative care-discussed with on call Dr. Robet Leu consulted (for residential hospice), end-of-life care order set placed. -started on hydromorphone infusion -We will continue Decadron and Keppra for seizure precautions at this time. -Palliative care to see tomorrow  Acute respiratory failure with hypoxia -likely in the setting of bilateral pleural effusion/lung mets -On 2 L of oxygen via nasal cannula -Discontinued antibiotics.  Other medical problems include Essential hypertension Macrocytic anemia Abnormal LFTs Primary osteoarthritis of both hips Allergy induced asthma Asymptomatic bacteriuria Elevated TSH  DVT prophylaxis: SCD Code Status: DNR-confirmed with the patient and her family at the bedside Family Communication: Patient is daughter and boyfriend present at bedside.  Plan of care discussed with patient in length and she verbalized understanding and agreed with it. Disposition Plan: Hospice  Consultants:  Pallliative  Procedures:  None  Antimicrobials:  Rocephin Azithromycin  Status is: Inpatient    Subjective: Patient seen and examined.  Appears weak, sick and lethargic, in mild respiratory distress.  Family at the bedside.  Patient requested for water.  Objective: Vitals:   08/08/22 0233 08/08/22 0615 08/08/22 0816 08/08/22 0817  BP: (!) 153/78 130/80 (!) 153/92   Pulse: 97 97 (!) 109 (!) 105  Resp: '18 18 16   '$ Temp:  97.9 F (36.6 C) 97.6 F (36.4 C) 98.3 F (36.8 C)   TempSrc:  Oral    SpO2: 91% 91% 90% 90%  Weight: 69.5 kg      Height: '5\' 5"'$  (1.651 m)       Intake/Output Summary (Last 24 hours) at 08/08/2022 1239 Last data filed at 08/08/2022 1228 Gross per 24 hour  Intake 1659.55 ml  Output --  Net 1659.55 ml   Filed Weights   07/24/2022 2128 08/08/22 0233  Weight: 72.6 kg 69.5 kg    Examination:  General exam:  on nasal cannula, appears weak, sick, lethargic, pale, dehydrated Respiratory system: In mild respiratory distress.  No wheezing or rhonchi.  Cardiovascular system: S1 & S2 heard, RRR. No JVD, murmurs, rubs, gallops or clicks. No pedal edema. Gastrointestinal system: Abdomen is nondistended, soft and nontender. No organomegaly or masses felt. Normal bowel sounds heard. Breast exam: large firm mass noted on outer upper quadrant of right breast. Non tender. Central nervous system: Alert, following commands.  Answers appropriately Skin: No rashes, lesions or ulcers   Data Reviewed: I have personally reviewed following labs and imaging studies  CBC: Recent Labs  Lab 08/11/2022 2144  WBC 5.8  HGB 11.1*  HCT 37.2  MCV 103.0*  PLT 329   Basic Metabolic Panel: Recent Labs  Lab 08/19/2022 2228 08/06/2022 2241  NA 142  --   K 3.6  --   CL 101  --   CO2 25  --   GLUCOSE 84  --   BUN 36*  --   CREATININE 0.98  --   CALCIUM 11.4*  --   MG  --  2.1   GFR: Estimated Creatinine Clearance: 58.3 mL/min (by C-G formula based on SCr of 0.98 mg/dL). Liver Function Tests: Recent Labs  Lab 07/23/2022 2241  AST 157*  ALT 36  ALKPHOS 119  BILITOT 1.4*  PROT 7.3  ALBUMIN 3.6   Recent Labs  Lab 08/20/2022 2241  LIPASE 36   No results for input(s): "AMMONIA" in the last 168 hours. Coagulation Profile: No results for input(s): "INR", "PROTIME" in the last 168 hours. Cardiac Enzymes: No results for input(s): "CKTOTAL", "CKMB", "CKMBINDEX", "TROPONINI" in the last 168 hours. BNP (last 3 results) No results for input(s): "PROBNP" in the last 8760 hours. HbA1C: No results for input(s): "HGBA1C"  in the last 72 hours. CBG: No results for input(s): "GLUCAP" in the last 168 hours. Lipid Profile: No results for input(s): "CHOL", "HDL", "LDLCALC", "TRIG", "CHOLHDL", "LDLDIRECT" in the last 72 hours. Thyroid Function Tests: Recent Labs    08/16/2022 2228 08/06/2022 2241  TSH  --  6.239*  FREET4 0.69  --    Anemia Panel: No results for input(s): "VITAMINB12", "FOLATE", "FERRITIN", "TIBC", "IRON", "RETICCTPCT" in the last 72 hours. Sepsis Labs: Recent Labs  Lab 08/19/2022 2241  PROCALCITON 0.42    Recent Results (from the past 240 hour(s))  SARS Coronavirus 2 by RT PCR (hospital order, performed in [facility] hospital lab) *cepheid single result test* Anterior Nasal Swab     Status: None   Collection Time: 08/08/22 12:25 AM   Specimen: Anterior Nasal Swab  Result Value Ref Range Status   SARS Coronavirus 2 by RT PCR NEGATIVE NEGATIVE Final    Comment: (NOTE) SARS-CoV-2 target nucleic acids are NOT DETECTED.  The SARS-CoV-2 RNA is generally detectable in upper and lower respiratory specimens during the acute phase of infection. The lowest concentration of SARS-CoV-2 viral copies this assay can detect is  250 copies / mL. A negative result does not preclude SARS-CoV-2 infection and should not be used as the sole basis for treatment or other patient management decisions.  A negative result may occur with improper specimen collection / handling, submission of specimen other than nasopharyngeal swab, presence of viral mutation(s) within the areas targeted by this assay, and inadequate number of viral copies (<250 copies / mL). A negative result must be combined with clinical observations, patient history, and epidemiological information.  Fact Sheet for Patients:   https://www.patel.info/  Fact Sheet for Healthcare Providers: https://hall.com/  This test is not yet approved or  cleared by the [place] FDA and has been authorized  for detection and/or diagnosis of SARS-CoV-2 by FDA under an Emergency Use Authorization (EUA).  This EUA will remain in effect (meaning this test can be used) for the duration of the COVID-19 declaration under Section 564(b)(1) of the Act, 21 U.S.C. section 360bbb-3(b)(1), unless the authorization is terminated or revoked sooner.  Performed at [facility], [address] [place], [place] [address]       Radiology Studies: MR Brain W and Wo Contrast  Result Date: 08/08/2022 CLINICAL DATA:  Metastatic disease EXAM: MRI HEAD WITHOUT AND WITH CONTRAST TECHNIQUE: Multiplanar, multiecho pulse sequences of the brain and surrounding structures were obtained without and with intravenous contrast. CONTRAST:  40m GADAVIST GADOBUTROL 1 MMOL/ML IV SOLN COMPARISON:  Head CT 07/31/2022 FINDINGS: Brain: There is diffuse dural thickening and contrast enhancement with areas of nodularity greatest anterior to both temporal lobes, along the falx cerebri and at the anterior right convexity. This includes the area of previously suspected subdural hematoma. There is no intracranial hemorrhage. No acute infarct. No midline shift or other mass effect. There is a small intraparenchymal lesion of the superior left cerebellum (series 18, image 60). There is a second small intraparenchymal lesion of the left parietal white matter (image 98). There is edema within both anterior temporal lobes and both superior frontal lobes likely due to mass effect from or direct invasion by extra-axial masses. Vascular: Major flow voids are preserved. Skull and upper cervical spine: There are multiple enhancing calvarial lesions multiple areas of abnormal contrast enhancement of the scalp, most notable in the right parietal region Sinuses/Orbits:Left maxillary sinus mucosal thickening. Normal orbits. IMPRESSION: 1. Diffuse dural metastatic disease with bulkiest areas of tumor at the anterior right convexity, along the falx cerebri  and anterior to both temporal lobes. 2. No intracranial hemorrhage. The area suspected subdural hematoma at the right convexity on the earlier CT corresponds to the nodular dural metastatic disease. 3. Edema within both anterior temporal lobes and both superior frontal lobes likely due to mass effect from or direct invasion by extra-axial masses. 4. Multiple calvarial metastases. Enhancing nodules of the scalp may also be metastatic. Electronically Signed   By: KUlyses JarredM.D.   On: 08/08/2022 02:23   CT HEAD WO CONTRAST (5MM)  Result Date: 08/08/2022 CLINICAL DATA:  Metastatic disease EXAM: CT HEAD WITHOUT CONTRAST TECHNIQUE: Contiguous axial images were obtained from the base of the skull through the vertex without intravenous contrast. RADIATION DOSE REDUCTION: This exam was performed according to the departmental dose-optimization program which includes automated exposure control, adjustment of the mA and/or kV according to patient size and/or use of iterative reconstruction technique. FINDINGS: Brain: There is a thin subdural hematoma over the right convexity, 6 mm. There is hypoattenuation within both anterior frontal lobes and the anterior right temporal lobe. No midline shift or  other mass effect. Vascular: No abnormal hyperdensity of the major intracranial arteries or dural venous sinuses. No intracranial atherosclerosis. Skull: The visualized skull base, calvarium and extracranial soft tissues are normal. Sinuses/Orbits: Opacification of the left maxillary sinus. The orbits are normal. IMPRESSION: 1. Thin subdural hematoma over the right convexity, 6 mm in thickness. No midline shift or other mass effect. 2. Hypoattenuation within both anterior frontal lobes and the anterior right temporal lobe, favored to be secondary to vasogenic edema and possibly indicating the presence of metastatic disease. MRI with and without contrast is recommended for further characterization. Critical Value/emergent  results were called by telephone at the time of interpretation on 08/08/2022 at 12:45 am to provider [facility] , who verbally acknowledged these results. Electronically Signed   By: Ulyses Jarred M.D.   On: 08/08/2022 00:45   CT Angio Chest PE W and/or Wo Contrast  Result Date: 08/08/2022 CLINICAL DATA:  Weakness for several months and weight loss, initial encounter EXAM: CT ANGIOGRAPHY CHEST CT ABDOMEN AND PELVIS WITH CONTRAST TECHNIQUE: Multidetector CT imaging of the chest was performed using the standard protocol during bolus administration of intravenous contrast. Multiplanar CT image reconstructions and MIPs were obtained to evaluate the vascular anatomy. Multidetector CT imaging of the abdomen and pelvis was performed using the standard protocol during bolus administration of intravenous contrast. RADIATION DOSE REDUCTION: This exam was performed according to the departmental dose-optimization program which includes automated exposure control, adjustment of the mA and/or kV according to patient size and/or use of iterative reconstruction technique. CONTRAST:  41m OMNIPAQUE IOHEXOL 350 MG/ML SOLN COMPARISON:  None Available. FINDINGS: CTA CHEST FINDINGS Cardiovascular: Thoracic aorta demonstrates a normal branching pattern. No aneurysmal dilatation or dissection is noted. Heart is mildly enlarged in size. No coronary calcifications are seen. The pulmonary artery shows a normal branching pattern bilaterally without filling defect to suggest pulmonary embolism. Mediastinum/Nodes: Thoracic inlet is within normal limits. Prominent axillary nodes are noted bilaterally measuring up to 11 mm in short axis on the right and 17 mm in short axis on the left. The left pectoralis minor muscle is somewhat nodular likely representing some associated adenopathy. Some adenopathy measuring up to 1 cm noted the left retropectoral region as well. Scattered lymph nodes are noted in the mediastinum measuring up to 11 mm in  short axis along the aorta. Small hilar nodes are noted as well. The esophagus appears within limits. Lungs/Pleura: Large bilateral pleural effusions are noted left slightly greater than right. A 1.6 by 0.9 cm nodule is noted in the medial aspect of the left upper lobe consistent with metastatic disease. Left lower lobe consolidation is identified. The right lung demonstrates a nodule in the upper lobe measuring approximately 9 mm in greatest dimension. A few scattered smaller nodules are noted in the right upper lobe as well as a masslike density in the right perihilar region which measures approximately 4.7 by 3.2 cm. A portion of this may be related to consolidation as well. Musculoskeletal: Bony structures of the chest demonstrate mottled sclerosis throughout the thoracic vertebral bodies as well as multiple small lytic lesions throughout the ribcage. Similar findings are noted within the sternum. Multiple healed rib fractures are noted bilaterally likely pathologic in nature. No compression deformity is seen. Lytic lesion is noted in the posterior elements of T10 on the right. The breasts are dense bilaterally. Diffuse skin thickening is noted over the left breast incompletely evaluated. 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 Further workup is recommended. Additionally  there is a soft tissue mass in the upper portion of the right breast which measures at least 3 cm in greatest dimension also suspicious for breast carcinoma. Review of the MIP images confirms the above findings. CT ABDOMEN and PELVIS FINDINGS Hepatobiliary: No focal liver abnormality is seen. No gallstones, gallbladder wall thickening, or biliary dilatation. Pancreas: Unremarkable. No pancreatic ductal dilatation or surrounding inflammatory changes. Spleen: Normal in size without focal abnormality. Adrenals/Urinary Tract: Adrenal glands are within normal limits bilaterally. Kidneys demonstrate a normal enhancement  pattern. No renal calculi or obstructive changes are noted. Normal excretion is noted on delayed images. The bladder is decompressed. Stomach/Bowel: The appendix is within normal limits. No obstructive or inflammatory changes colon seen. Small bowel and stomach are unremarkable. Vascular/Lymphatic: No significant vascular findings are present. No enlarged abdominal or pelvic lymph nodes. Reproductive: Status post hysterectomy. No adnexal masses. Other: No free fluid is noted.  No herniation is seen. Musculoskeletal: Bilateral hip replacements are noted. Mottled sclerosis is noted throughout the visualized bony structures the abdomen pelvis similar to that seen chest again consistent with metastatic disease. No compression deformities are noted. Postsurgical fusion is noted in the lumbar spine posteriorly. Review of the MIP images confirms the above findings. IMPRESSION: CTA of the chest: No evidence of pulmonary emboli. Constellation of findings consistent with breast carcinoma( possibly bilaterally) with diffuse skeletal metastatic disease, bilateral axillary and left retropectoral adenopathy, bilateral pleural effusions and as well as bilateral parenchymal lung nodules and a right perihilar mass lesion. Further workup of the breast abnormalities is recommended. CT of the abdomen and pelvis: Diffuse skeletal metastatic disease. No other focal abnormality is noted. Electronically Signed   By: Inez Catalina M.D.   On: 08/08/2022 00:41   CT ABDOMEN PELVIS W CONTRAST  Result Date: 08/08/2022 CLINICAL DATA:  Weakness for several months and weight loss, initial encounter EXAM: CT ANGIOGRAPHY CHEST CT ABDOMEN AND PELVIS WITH CONTRAST TECHNIQUE: Multidetector CT imaging of the chest was performed using the standard protocol during bolus administration of intravenous contrast. Multiplanar CT image reconstructions and MIPs were obtained to evaluate the vascular anatomy. Multidetector CT imaging of the abdomen and pelvis  was performed using the standard protocol during bolus administration of intravenous contrast. RADIATION DOSE REDUCTION: This exam was performed according to the departmental dose-optimization program which includes automated exposure control, adjustment of the mA and/or kV according to patient size and/or use of iterative reconstruction technique. CONTRAST:  40m OMNIPAQUE IOHEXOL 350 MG/ML SOLN COMPARISON:  None Available. FINDINGS: CTA CHEST FINDINGS Cardiovascular: Thoracic aorta demonstrates a normal branching pattern. No aneurysmal dilatation or dissection is noted. Heart is mildly enlarged in size. No coronary calcifications are seen. The pulmonary artery shows a normal branching pattern bilaterally without filling defect to suggest pulmonary embolism. Mediastinum/Nodes: Thoracic inlet is within normal limits. Prominent axillary nodes are noted bilaterally measuring up to 11 mm in short axis on the right and 17 mm in short axis on the left. The left pectoralis minor muscle is somewhat nodular likely representing some associated adenopathy. Some adenopathy measuring up to 1 cm noted the left retropectoral region as well. Scattered lymph nodes are noted in the mediastinum measuring up to 11 mm in short axis along the aorta. Small hilar nodes are noted as well. The esophagus appears within limits. Lungs/Pleura: Large bilateral pleural effusions are noted left slightly greater than right. A 1.6 by 0.9 cm nodule is noted in the medial aspect of the left upper lobe consistent with metastatic disease. Left  lower lobe consolidation is identified. The right lung demonstrates a nodule in the upper lobe measuring approximately 9 mm in greatest dimension. A few scattered smaller nodules are noted in the right upper lobe as well as a masslike density in the right perihilar region which measures approximately 4.7 by 3.2 cm. A portion of this may be related to consolidation as well. Musculoskeletal: Bony structures of the  chest demonstrate mottled sclerosis throughout the thoracic vertebral bodies as well as multiple small lytic lesions throughout the ribcage. Similar findings are noted within the sternum. Multiple healed rib fractures are noted bilaterally likely pathologic in nature. No compression deformity is seen. Lytic lesion is noted in the posterior elements of T10 on the right. The breasts are dense bilaterally. Diffuse skin thickening is noted over the left breast incompletely evaluated. 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 Further workup is recommended. Additionally there is a soft tissue mass in the upper portion of the right breast which measures at least 3 cm in greatest dimension also suspicious for breast carcinoma. Review of the MIP images confirms the above findings. CT ABDOMEN and PELVIS FINDINGS Hepatobiliary: No focal liver abnormality is seen. No gallstones, gallbladder wall thickening, or biliary dilatation. Pancreas: Unremarkable. No pancreatic ductal dilatation or surrounding inflammatory changes. Spleen: Normal in size without focal abnormality. Adrenals/Urinary Tract: Adrenal glands are within normal limits bilaterally. Kidneys demonstrate a normal enhancement pattern. No renal calculi or obstructive changes are noted. Normal excretion is noted on delayed images. The bladder is decompressed. Stomach/Bowel: The appendix is within normal limits. No obstructive or inflammatory changes colon seen. Small bowel and stomach are unremarkable. Vascular/Lymphatic: No significant vascular findings are present. No enlarged abdominal or pelvic lymph nodes. Reproductive: Status post hysterectomy. No adnexal masses. Other: No free fluid is noted.  No herniation is seen. Musculoskeletal: Bilateral hip replacements are noted. Mottled sclerosis is noted throughout the visualized bony structures the abdomen pelvis similar to that seen chest again consistent with metastatic disease. No compression  deformities are noted. Postsurgical fusion is noted in the lumbar spine posteriorly. Review of the MIP images confirms the above findings. IMPRESSION: CTA of the chest: No evidence of pulmonary emboli. Constellation of findings consistent with breast carcinoma( possibly bilaterally) with diffuse skeletal metastatic disease, bilateral axillary and left retropectoral adenopathy, bilateral pleural effusions and as well as bilateral parenchymal lung nodules and a right perihilar mass lesion. Further workup of the breast abnormalities is recommended. CT of the abdomen and pelvis: Diffuse skeletal metastatic disease. No other focal abnormality is noted. Electronically Signed   By: Inez Catalina M.D.   On: 08/08/2022 00:41    Scheduled Meds:  dexamethasone (DECADRON) injection  4 mg Intravenous Q8H   lisinopril  40 mg Oral Daily   Continuous Infusions:  sodium chloride 75 mL/hr at 08/08/22 0913   azithromycin 500 mg (08/08/22 0501)   [START ON 09-02-22] cefTRIAXone (ROCEPHIN)  IV     levETIRAcetam 500 mg (08/08/22 0426)     LOS: 0 days   Time spent: 50 minutes   Caiden Monsivais Loann Quill, MD [facility]  If 7PM-7AM, please contact night-coverage [email or web address] 08/08/2022, 12:39 PM

## 2022-08-08 NOTE — H&P (Addendum)
History and Physical    Patient: Caitlyn Robertson CHE:[id number] DOB: [birth year] DOA: 07/31/2022 DOS: the patient was seen and examined on 08/08/2022 PCP: Jearld Fenton, NP  Patient coming from: Home  Chief Complaint:  Chief Complaint  Patient presents with   Weakness    HPI: Caitlyn Robertson is a 61 y.o. female with medical history significant for HTN who presents to the ED for evaluation of weakness that has been ongoing for the past 7 months associated with decreased oral intake, persistent nausea, and a 70 pound unintentional weight loss.  She is symptomatic for cough and shortness of breath.  Lately she has been using a walker due to protracted weakness.  Patient had several outpatient visits and has been treated for asthma and sinusitis.  Review of records reveals health maintenance visit in June in which a mammogram and bone density were ordered.  She declined colonoscopy and Cologuard at that time.  Blood work revealed elevated liver enzymes for which an ultrasound was ordered that showed an abnormal finding with recommendation for MRI of the abdomen for which authorization was requested at the time.  Due to persistent and worsening symptoms patient came into the ED for evaluation. ED course and data review: Tachycardic to 103 with O2 sat 82% on room air improving to the mid 90s on 2 L and with otherwise normal vitals.  Labs significant for hemoglobin of 11.1 with MCV 103, calcium 11.4 with anion gap 16, troponin 57 with BNP 114 and TSH 6.23.  AST 157 with ALT 36 and total bili 1.4.  Lipase 36.  Pro-Calc 0.42.  Urinalysis with large leukocyte esterase, COVID-negative and UDS clean.  EKG, personally reviewed and interpreted showing sinus tachycardia at 104 with no acute ST-T wave changes. Imaging showing the following: IMPRESSION: CTA of the chest: No evidence of pulmonary emboli.   Constellation of findings consistent with breast carcinoma( possibly bilaterally) with diffuse  skeletal metastatic disease, bilateral axillary and left retropectoral adenopathy, bilateral pleural effusions and as well as bilateral parenchymal lung nodules and a right perihilar mass lesion. Further workup of the breast abnormalities is recommended.   CT of the abdomen and pelvis: Diffuse skeletal metastatic disease.   CT head IMPRESSION: 1. Diffuse dural metastatic disease with bulkiest areas of tumor at the anterior right convexity, along the falx cerebri and anterior to both temporal lobes. 2. No intracranial hemorrhage. The area suspected subdural hematoma at the right convexity on the earlier CT corresponds to the nodular dural metastatic disease. 3. Edema within both anterior temporal lobes and both superior frontal lobes likely due to mass effect from or direct invasion by extra-axial masses. 4. Multiple calvarial metastases. Enhancing nodules of the scalp may also be metastatic.   Patient treated with a sodium chloride bolus started on Decadron as well as ceftriaxone and hospitalist consulted for admission.   Review of Systems: As mentioned in the history of present illness. All other systems reviewed and are negative.  Past Medical History:  Diagnosis Date   Allergy    Arthritis    Asthma    History of chicken pox    Hypertension    Past Surgical History:  Procedure Laterality Date   ABDOMINAL HYSTERECTOMY  2000   partial   BACK SURGERY  1973   excision of tumor lumbar region   CESAREAN SECTION     DILATION AND CURETTAGE OF UTERUS     TONSILLECTOMY AND ADENOIDECTOMY     Social History:  reports that she  has never smoked. She has never used smokeless tobacco. She reports current alcohol use. She reports that she does not use drugs.  Allergies  Allergen Reactions   Oxycodone     Family History  Problem Relation Age of Onset   Arthritis Mother    Colon cancer Mother    Stroke Mother    Arthritis Father    Asthma Father    Diabetes Father     Hypertension Father    Asthma Daughter    Stroke Maternal Grandmother    Arthritis Maternal Grandfather    Depression Maternal Grandfather    Heart disease Paternal Grandfather    Arthritis Paternal Grandfather     Prior to Admission medications   Medication Sig Start Date End Date Taking? Authorizing Provider  acetaminophen (TYLENOL) 500 MG tablet Take 500 mg by mouth as needed.    [provider]  albuterol (VENTOLIN HFA) 108 (90 Base) MCG/ACT inhaler Inhale 1-2 puffs into the lungs every 4 (four) hours as needed for wheezing or shortness of breath. 06/25/22   Jearld Fenton, NP  benzonatate (TESSALON) 200 MG capsule Take 1 capsule (200 mg total) by mouth 3 (three) times daily as needed for cough. 05/18/22   Jearld Fenton, NP  budesonide (PULMICORT FLEXHALER) 180 MCG/ACT inhaler Inhale 1 puff into the lungs in the morning and at bedtime. 07/13/22   Jearld Fenton, NP  fluticasone (FLONASE) 50 MCG/ACT nasal spray Place into both nostrils daily.    [provider]  levocetirizine (XYZAL) 5 MG tablet Take 1 tablet (5 mg total) by mouth every evening. 04/21/22   Parks Ranger, Devonne Doughty, DO  lisinopril (ZESTRIL) 40 MG tablet TAKE 1 TABLET BY MOUTH EVERY DAY 07/12/22   Jearld Fenton, NP  montelukast (SINGULAIR) 10 MG tablet TAKE 1 TABLET BY MOUTH EVERYDAY AT BEDTIME 07/12/22   Jearld Fenton, NP    Physical Exam: Vitals:   08/12/2022 2330 08/08/22 0030 08/08/22 0100 08/08/22 0233  BP: (!) 154/90 (!) 163/86 (!) 151/86 (!) 153/78  Pulse: 99 97 98 97  Resp: 19 17 (!) 26 18  Temp:    97.9 F (36.6 C)  TempSrc:      SpO2: 95% 94% 96% 91%  Weight:      Height:       Physical Exam Vitals and nursing note reviewed.  Constitutional:      General: She is not in acute distress.    Comments: Somnolent due to ativan administered in the Ed to undergo imaging studies. Arousable but will readily fall back asleep  HENT:     Head: Normocephalic and atraumatic.  Cardiovascular:      Rate and Rhythm: Normal rate and regular rhythm.     Heart sounds: Normal heart sounds.  Pulmonary:     Effort: Tachypnea present. No respiratory distress.     Breath sounds: Decreased breath sounds present.  Abdominal:     Palpations: Abdomen is soft.     Tenderness: There is no abdominal tenderness.     Labs on Admission: I have personally reviewed following labs and imaging studies  CBC: Recent Labs  Lab 07/24/2022 2144  WBC 5.8  HGB 11.1*  HCT 37.2  MCV 103.0*  PLT 202   Basic Metabolic Panel: Recent Labs  Lab 08/01/2022 2228 08/14/2022 2241  NA 142  --   K 3.6  --   CL 101  --   CO2 25  --   GLUCOSE 84  --  BUN 36*  --   CREATININE 0.98  --   CALCIUM 11.4*  --   MG  --  2.1   GFR: Estimated Creatinine Clearance: 59.4 mL/min (by C-G formula based on SCr of 0.98 mg/dL). Liver Function Tests: Recent Labs  Lab 08/06/2022 2241  AST 157*  ALT 36  ALKPHOS 119  BILITOT 1.4*  PROT 7.3  ALBUMIN 3.6   Recent Labs  Lab 07/26/2022 2241  LIPASE 36   No results for input(s): "AMMONIA" in the last 168 hours. Coagulation Profile: No results for input(s): "INR", "PROTIME" in the last 168 hours. Cardiac Enzymes: No results for input(s): "CKTOTAL", "CKMB", "CKMBINDEX", "TROPONINI" in the last 168 hours. BNP (last 3 results) No results for input(s): "PROBNP" in the last 8760 hours. HbA1C: No results for input(s): "HGBA1C" in the last 72 hours. CBG: No results for input(s): "GLUCAP" in the last 168 hours. Lipid Profile: No results for input(s): "CHOL", "HDL", "LDLCALC", "TRIG", "CHOLHDL", "LDLDIRECT" in the last 72 hours. Thyroid Function Tests: Recent Labs    08/16/2022 2228 07/28/2022 2241  TSH  --  6.239*  FREET4 0.69  --    Anemia Panel: No results for input(s): "VITAMINB12", "FOLATE", "FERRITIN", "TIBC", "IRON", "RETICCTPCT" in the last 72 hours. Urine analysis:    Component Value Date/Time   COLORURINE YELLOW (A) 08/08/2022 0025   APPEARANCEUR CLOUDY (A)  08/08/2022 0025   LABSPEC >1.046 (H) 08/08/2022 0025   PHURINE 5.0 08/08/2022 0025   GLUCOSEU NEGATIVE 08/08/2022 0025   HGBUR NEGATIVE 08/08/2022 0025   BILIRUBINUR NEGATIVE 08/08/2022 0025   BILIRUBINUR neg 03/14/2018 1636   KETONESUR 20 (A) 08/08/2022 0025   PROTEINUR 30 (A) 08/08/2022 0025   UROBILINOGEN 0.2 03/14/2018 1636   NITRITE NEGATIVE 08/08/2022 0025   LEUKOCYTESUR LARGE (A) 08/08/2022 0025    Radiological Exams on Admission: MR Brain W and Wo Contrast  Result Date: 08/08/2022 CLINICAL DATA:  Metastatic disease EXAM: MRI HEAD WITHOUT AND WITH CONTRAST TECHNIQUE: Multiplanar, multiecho pulse sequences of the brain and surrounding structures were obtained without and with intravenous contrast. CONTRAST:  74m GADAVIST GADOBUTROL 1 MMOL/ML IV SOLN COMPARISON:  Head CT 08/08/2022 FINDINGS: Brain: There is diffuse dural thickening and contrast enhancement with areas of nodularity greatest anterior to both temporal lobes, along the falx cerebri and at the anterior right convexity. This includes the area of previously suspected subdural hematoma. There is no intracranial hemorrhage. No acute infarct. No midline shift or other mass effect. There is a small intraparenchymal lesion of the superior left cerebellum (series 18, image 60). There is a second small intraparenchymal lesion of the left parietal white matter (image 98). There is edema within both anterior temporal lobes and both superior frontal lobes likely due to mass effect from or direct invasion by extra-axial masses. Vascular: Major flow voids are preserved. Skull and upper cervical spine: There are multiple enhancing calvarial lesions multiple areas of abnormal contrast enhancement of the scalp, most notable in the right parietal region Sinuses/Orbits:Left maxillary sinus mucosal thickening. Normal orbits. IMPRESSION: 1. Diffuse dural metastatic disease with bulkiest areas of tumor at the anterior right convexity, along the falx  cerebri and anterior to both temporal lobes. 2. No intracranial hemorrhage. The area suspected subdural hematoma at the right convexity on the earlier CT corresponds to the nodular dural metastatic disease. 3. Edema within both anterior temporal lobes and both superior frontal lobes likely due to mass effect from or direct invasion by extra-axial masses. 4. Multiple calvarial metastases. Enhancing nodules of the  scalp may also be metastatic. Electronically Signed   By: Ulyses Jarred M.D.   On: 08/08/2022 02:23   CT HEAD WO CONTRAST (5MM)  Result Date: 08/08/2022 CLINICAL DATA:  Metastatic disease EXAM: CT HEAD WITHOUT CONTRAST TECHNIQUE: Contiguous axial images were obtained from the base of the skull through the vertex without intravenous contrast. RADIATION DOSE REDUCTION: This exam was performed according to the departmental dose-optimization program which includes automated exposure control, adjustment of the mA and/or kV according to patient size and/or use of iterative reconstruction technique. FINDINGS: Brain: There is a thin subdural hematoma over the right convexity, 6 mm. There is hypoattenuation within both anterior frontal lobes and the anterior right temporal lobe. No midline shift or other mass effect. Vascular: No abnormal hyperdensity of the major intracranial arteries or dural venous sinuses. No intracranial atherosclerosis. Skull: The visualized skull base, calvarium and extracranial soft tissues are normal. Sinuses/Orbits: Opacification of the left maxillary sinus. The orbits are normal. IMPRESSION: 1. Thin subdural hematoma over the right convexity, 6 mm in thickness. No midline shift or other mass effect. 2. Hypoattenuation within both anterior frontal lobes and the anterior right temporal lobe, favored to be secondary to vasogenic edema and possibly indicating the presence of metastatic disease. MRI with and without contrast is recommended for further characterization. Critical  Value/emergent results were called by telephone at the time of interpretation on 08/08/2022 at 12:45 am to provider [facility] , who verbally acknowledged these results. Electronically Signed   By: Ulyses Jarred M.D.   On: 08/08/2022 00:45   CT Angio Chest PE W and/or Wo Contrast  Result Date: 08/08/2022 CLINICAL DATA:  Weakness for several months and weight loss, initial encounter EXAM: CT ANGIOGRAPHY CHEST CT ABDOMEN AND PELVIS WITH CONTRAST TECHNIQUE: Multidetector CT imaging of the chest was performed using the standard protocol during bolus administration of intravenous contrast. Multiplanar CT image reconstructions and MIPs were obtained to evaluate the vascular anatomy. Multidetector CT imaging of the abdomen and pelvis was performed using the standard protocol during bolus administration of intravenous contrast. RADIATION DOSE REDUCTION: This exam was performed according to the departmental dose-optimization program which includes automated exposure control, adjustment of the mA and/or kV according to patient size and/or use of iterative reconstruction technique. CONTRAST:  44m OMNIPAQUE IOHEXOL 350 MG/ML SOLN COMPARISON:  None Available. FINDINGS: CTA CHEST FINDINGS Cardiovascular: Thoracic aorta demonstrates a normal branching pattern. No aneurysmal dilatation or dissection is noted. Heart is mildly enlarged in size. No coronary calcifications are seen. The pulmonary artery shows a normal branching pattern bilaterally without filling defect to suggest pulmonary embolism. Mediastinum/Nodes: Thoracic inlet is within normal limits. Prominent axillary nodes are noted bilaterally measuring up to 11 mm in short axis on the right and 17 mm in short axis on the left. The left pectoralis minor muscle is somewhat nodular likely representing some associated adenopathy. Some adenopathy measuring up to 1 cm noted the left retropectoral region as well. Scattered lymph nodes are noted in the mediastinum measuring  up to 11 mm in short axis along the aorta. Small hilar nodes are noted as well. The esophagus appears within limits. Lungs/Pleura: Large bilateral pleural effusions are noted left slightly greater than right. A 1.6 by 0.9 cm nodule is noted in the medial aspect of the left upper lobe consistent with metastatic disease. Left lower lobe consolidation is identified. The right lung demonstrates a nodule in the upper lobe measuring approximately 9 mm in greatest dimension. A few scattered smaller nodules are  noted in the right upper lobe as well as a masslike density in the right perihilar region which measures approximately 4.7 by 3.2 cm. A portion of this may be related to consolidation as well. Musculoskeletal: Bony structures of the chest demonstrate mottled sclerosis throughout the thoracic vertebral bodies as well as multiple small lytic lesions throughout the ribcage. Similar findings are noted within the sternum. Multiple healed rib fractures are noted bilaterally likely pathologic in nature. No compression deformity is seen. Lytic lesion is noted in the posterior elements of T10 on the right. The breasts are dense bilaterally. Diffuse skin thickening is noted over the left breast incompletely evaluated. 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 Further workup is recommended. Additionally there is a soft tissue mass in the upper portion of the right breast which measures at least 3 cm in greatest dimension also suspicious for breast carcinoma. Review of the MIP images confirms the above findings. CT ABDOMEN and PELVIS FINDINGS Hepatobiliary: No focal liver abnormality is seen. No gallstones, gallbladder wall thickening, or biliary dilatation. Pancreas: Unremarkable. No pancreatic ductal dilatation or surrounding inflammatory changes. Spleen: Normal in size without focal abnormality. Adrenals/Urinary Tract: Adrenal glands are within normal limits bilaterally. Kidneys demonstrate a normal  enhancement pattern. No renal calculi or obstructive changes are noted. Normal excretion is noted on delayed images. The bladder is decompressed. Stomach/Bowel: The appendix is within normal limits. No obstructive or inflammatory changes colon seen. Small bowel and stomach are unremarkable. Vascular/Lymphatic: No significant vascular findings are present. No enlarged abdominal or pelvic lymph nodes. Reproductive: Status post hysterectomy. No adnexal masses. Other: No free fluid is noted.  No herniation is seen. Musculoskeletal: Bilateral hip replacements are noted. Mottled sclerosis is noted throughout the visualized bony structures the abdomen pelvis similar to that seen chest again consistent with metastatic disease. No compression deformities are noted. Postsurgical fusion is noted in the lumbar spine posteriorly. Review of the MIP images confirms the above findings. IMPRESSION: CTA of the chest: No evidence of pulmonary emboli. Constellation of findings consistent with breast carcinoma( possibly bilaterally) with diffuse skeletal metastatic disease, bilateral axillary and left retropectoral adenopathy, bilateral pleural effusions and as well as bilateral parenchymal lung nodules and a right perihilar mass lesion. Further workup of the breast abnormalities is recommended. CT of the abdomen and pelvis: Diffuse skeletal metastatic disease. No other focal abnormality is noted. Electronically Signed   By: Inez Catalina M.D.   On: 08/08/2022 00:41   CT ABDOMEN PELVIS W CONTRAST  Result Date: 08/08/2022 CLINICAL DATA:  Weakness for several months and weight loss, initial encounter EXAM: CT ANGIOGRAPHY CHEST CT ABDOMEN AND PELVIS WITH CONTRAST TECHNIQUE: Multidetector CT imaging of the chest was performed using the standard protocol during bolus administration of intravenous contrast. Multiplanar CT image reconstructions and MIPs were obtained to evaluate the vascular anatomy. Multidetector CT imaging of the abdomen  and pelvis was performed using the standard protocol during bolus administration of intravenous contrast. RADIATION DOSE REDUCTION: This exam was performed according to the departmental dose-optimization program which includes automated exposure control, adjustment of the mA and/or kV according to patient size and/or use of iterative reconstruction technique. CONTRAST:  1m OMNIPAQUE IOHEXOL 350 MG/ML SOLN COMPARISON:  None Available. FINDINGS: CTA CHEST FINDINGS Cardiovascular: Thoracic aorta demonstrates a normal branching pattern. No aneurysmal dilatation or dissection is noted. Heart is mildly enlarged in size. No coronary calcifications are seen. The pulmonary artery shows a normal branching pattern bilaterally without filling defect to suggest pulmonary embolism. Mediastinum/Nodes:  Thoracic inlet is within normal limits. Prominent axillary nodes are noted bilaterally measuring up to 11 mm in short axis on the right and 17 mm in short axis on the left. The left pectoralis minor muscle is somewhat nodular likely representing some associated adenopathy. Some adenopathy measuring up to 1 cm noted the left retropectoral region as well. Scattered lymph nodes are noted in the mediastinum measuring up to 11 mm in short axis along the aorta. Small hilar nodes are noted as well. The esophagus appears within limits. Lungs/Pleura: Large bilateral pleural effusions are noted left slightly greater than right. A 1.6 by 0.9 cm nodule is noted in the medial aspect of the left upper lobe consistent with metastatic disease. Left lower lobe consolidation is identified. The right lung demonstrates a nodule in the upper lobe measuring approximately 9 mm in greatest dimension. A few scattered smaller nodules are noted in the right upper lobe as well as a masslike density in the right perihilar region which measures approximately 4.7 by 3.2 cm. A portion of this may be related to consolidation as well. Musculoskeletal: Bony  structures of the chest demonstrate mottled sclerosis throughout the thoracic vertebral bodies as well as multiple small lytic lesions throughout the ribcage. Similar findings are noted within the sternum. Multiple healed rib fractures are noted bilaterally likely pathologic in nature. No compression deformity is seen. Lytic lesion is noted in the posterior elements of T10 on the right. The breasts are dense bilaterally. Diffuse skin thickening is noted over the left breast incompletely evaluated. 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 Further workup is recommended. Additionally there is a soft tissue mass in the upper portion of the right breast which measures at least 3 cm in greatest dimension also suspicious for breast carcinoma. Review of the MIP images confirms the above findings. CT ABDOMEN and PELVIS FINDINGS Hepatobiliary: No focal liver abnormality is seen. No gallstones, gallbladder wall thickening, or biliary dilatation. Pancreas: Unremarkable. No pancreatic ductal dilatation or surrounding inflammatory changes. Spleen: Normal in size without focal abnormality. Adrenals/Urinary Tract: Adrenal glands are within normal limits bilaterally. Kidneys demonstrate a normal enhancement pattern. No renal calculi or obstructive changes are noted. Normal excretion is noted on delayed images. The bladder is decompressed. Stomach/Bowel: The appendix is within normal limits. No obstructive or inflammatory changes colon seen. Small bowel and stomach are unremarkable. Vascular/Lymphatic: No significant vascular findings are present. No enlarged abdominal or pelvic lymph nodes. Reproductive: Status post hysterectomy. No adnexal masses. Other: No free fluid is noted.  No herniation is seen. Musculoskeletal: Bilateral hip replacements are noted. Mottled sclerosis is noted throughout the visualized bony structures the abdomen pelvis similar to that seen chest again consistent with metastatic  disease. No compression deformities are noted. Postsurgical fusion is noted in the lumbar spine posteriorly. Review of the MIP images confirms the above findings. IMPRESSION: CTA of the chest: No evidence of pulmonary emboli. Constellation of findings consistent with breast carcinoma( possibly bilaterally) with diffuse skeletal metastatic disease, bilateral axillary and left retropectoral adenopathy, bilateral pleural effusions and as well as bilateral parenchymal lung nodules and a right perihilar mass lesion. Further workup of the breast abnormalities is recommended. CT of the abdomen and pelvis: Diffuse skeletal metastatic disease. No other focal abnormality is noted. Electronically Signed   By: Inez Catalina M.D.   On: 08/08/2022 00:41     Data Reviewed: Relevant notes from primary care and specialist visits, past discharge summaries as available in EHR, including Care Everywhere. 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 Seizure prophylaxis with Keppra.   Neurochecks  consider neurosurgery consult in the am - Pain control - Oncology consult and recommend palliative care consult  Acute respiratory failure with hypoxia (HCC) Bilateral pleural effusions Possible pneumonia Patient with shortness of breath bilateral pleural effusions, procalcitonin  0.2 O2 sat 82% on room air requiring 2 L to maintain sats in the mid 90s Empirically started on Rocephin which I will continue for now Thoracentesis ordered for diagnostic and therapeutic purposes We will keep n.p.o. and SCD for DVT prophylaxis  Macrocytic anemia Appears stable    Latest Ref Rng & Units 08/08/2022    9:44 PM 05/13/2022    1:32 PM 05/08/2020    9:08 AM  CBC  WBC 4.0 - 10.5 K/uL 5.8  4.9  7.0   Hemoglobin 12.0 - 15.0 g/dL 11.1  10.7  14.5   Hematocrit 36.0 - 46.0 % 37.2  31.9  43.8   Platelets 150 - 400 K/uL 188  118  218.0      Abnormal LFTs Possibly related to metastatic disease Continue to monitor  Essential hypertension Continue lisinopril        DVT prophylaxis:SCD  Consults: none  Advance Care Planning: full code  Family Communication: none  Disposition Plan: Back to previous home environment  Severity of Illness: The appropriate patient status for this patient is INPATIENT. Inpatient status is judged to be reasonable and necessary in order to provide the required intensity of service to ensure the patient's safety. The patient's presenting symptoms, physical exam findings, and initial radiographic and laboratory data in the context of their chronic comorbidities is felt to place them at high risk for further clinical deterioration. Furthermore, it is not anticipated that the patient will be medically stable for discharge from the hospital within 2 midnights of admission.   * I certify that at the point of admission it is my clinical judgment that the patient will require inpatient hospital care spanning beyond 2 midnights from the point of admission due to high intensity of service, high risk for further deterioration and high frequency of surveillance required.*  Author: Athena Masse, MD 08/08/2022 2:48 AM  For on call review [email or web address].

## 2022-08-08 NOTE — Assessment & Plan Note (Signed)
Bilateral pleural effusions Possible pneumonia Patient with shortness of breath bilateral pleural effusions, procalcitonin 0.2 O2 sat 82% on room air requiring 2 L to maintain sats in the mid 90s Empirically started on Rocephin which I will continue for now Thoracentesis ordered for diagnostic and therapeutic purposes We will keep n.p.o. and SCD for DVT prophylaxis

## 2022-08-08 NOTE — Assessment & Plan Note (Signed)
Appears stable    Latest Ref Rng & Units 07/25/2022    9:44 PM 05/13/2022    1:32 PM 05/08/2020    9:08 AM  CBC  WBC 4.0 - 10.5 K/uL 5.8  4.9  7.0   Hemoglobin 12.0 - 15.0 g/dL 11.1  10.7  14.5   Hematocrit 36.0 - 46.0 % 37.2  31.9  43.8   Platelets 150 - 400 K/uL 188  118  218.0

## 2022-08-09 LAB — URINE CULTURE

## 2022-08-10 ENCOUNTER — Ambulatory Visit: Payer: BC Managed Care – PPO | Admitting: Internal Medicine

## 2022-08-22 NOTE — Progress Notes (Signed)
Patient assigned to me this morning for rounding but nursing staff notified me that patient passed away before 7 AM.  I did not get to see this patient.  Gerlean Ren MD

## 2022-08-22 NOTE — Death Summary Note (Signed)
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 female with medical history significant for HTN who presented to the ED for evaluation of weakness that has been ongoing for the past 7 months associated with decreased oral intake, persistent nausea, cough, shortness of breath and a 70 pound unintentional weight loss.  patient  was diagnosed with metastatic cancer, presumed to be breast cancer. Due to extensive nature of disease which carried poor prognosis, palliative care was consulted and after a long discussion with family, they chose to forego any further workup and decided to make her DNR and chose comfort care. Patient was started on hydromorphone infusion and subsequently passed away on time mentioned above.   She also came in with Acute respiratory failure with hypoxia -likely in the setting of bilateral pleural effusion/lung mets -she was On 2 L of oxygen via nasal cannula -Discontinued antibiotics.   ED course: Upon arrival patient was Tachycardic to 103 with O2 sat 82% on room air improving to the mid 90s on 2 L and with otherwise normal vitals.  Labs significant for hemoglobin of 11.1 with MCV 103, calcium  11.4 with anion gap 16, troponin 57 with BNP 114 and TSH 6.23.  AST 157 with ALT 36 and total bili 1.4.  Lipase 36.  Pro-Calc 0.42.  Urinalysis with large leukocyte esterase, COVID-negative and UDS clean.  EKG, personally reviewed and interpreted showing sinus tachycardia at 104 with no acute ST-T wave changes.  CTA chest shows no PE, finding consistent with breast carcinoma possibly bilaterally with diffuse skeletal metastatic disease, bilateral axillary and left retropectoral adenopathy, bilateral pleural effusion as well as bilateral parenchymal lung nodules and right perihilar mass lesion.  CT abdomen/pelvis shows diffuse skeletal metastasis disease.  MRI brain shows diffuse dural metastasis disease with bulky is to areas of tumor at the anterior right convexity, along the falx cerebri and anterior to both temporal lobes.  No intracranial hemorrhage.  Edema within both anterior temporal lobes and both superior frontal lobes likely due to mass effect from the direct invasion by extra-axial masses.  Multiple calvarial metastasis.  Enhancing nodules of the scalp may also be metastasis.   Patient was given IV fluids, Decadron, Rocephin and hospitalist consulted for admission.   Assessment and Plan:   See above  The results of significant diagnostics from this hospitalization (including imaging, microbiology, ancillary and laboratory) are listed below for reference.   Significant Diagnostic Studies: MR Brain W and Wo Contrast  Result Date: 08/08/2022 CLINICAL DATA:  Metastatic disease EXAM: MRI HEAD WITHOUT AND WITH CONTRAST TECHNIQUE: Multiplanar, multiecho pulse sequences of the brain and surrounding structures were obtained without and with intravenous contrast. CONTRAST:  53m GADAVIST GADOBUTROL 1 MMOL/ML IV SOLN COMPARISON:  Head CT [id number]: Brain: There is diffuse dural thickening and contrast enhancement with areas of nodularity  greatest anterior to both temporal lobes, along the falx  cerebri and at the anterior right convexity. This includes the area of previously suspected subdural hematoma. There is no intracranial hemorrhage. No acute infarct. No midline shift or other mass effect. There is a small intraparenchymal lesion of the superior left cerebellum (series 18, image 60). There is a second small intraparenchymal lesion of the left parietal white matter (image 98). There is edema within both anterior temporal lobes and both superior frontal lobes likely due to mass effect from or direct invasion by extra-axial masses. Vascular: Major flow voids are preserved. Skull and upper cervical spine: There are multiple enhancing calvarial lesions multiple areas of abnormal contrast enhancement of the scalp, most notable in the right parietal region Sinuses/Orbits:Left maxillary sinus mucosal thickening. Normal orbits. IMPRESSION: 1. Diffuse dural metastatic disease with bulkiest areas of tumor at the anterior right convexity, along the falx cerebri and anterior to both temporal lobes. 2. No intracranial hemorrhage. The area suspected subdural hematoma at the right convexity on the earlier CT corresponds to the nodular dural metastatic disease. 3. Edema within both anterior temporal lobes and both superior frontal lobes likely due to mass effect from or direct invasion by extra-axial masses. 4. Multiple calvarial metastases. Enhancing nodules of the scalp may also be metastatic. Electronically Signed   By: Ulyses Jarred M.D.   On: 08/08/2022 02:23   CT HEAD WO CONTRAST (5MM)  Result Date: 08/08/2022 CLINICAL DATA:  Metastatic disease EXAM: CT HEAD WITHOUT CONTRAST TECHNIQUE: Contiguous axial images were obtained from the base of the skull through the vertex without intravenous contrast. RADIATION DOSE REDUCTION: This exam was performed according to the departmental dose-optimization program which includes automated exposure control, adjustment of the mA and/or kV according to patient size and/or  use of iterative reconstruction technique. FINDINGS: Brain: There is a thin subdural hematoma over the right convexity, 6 mm. There is hypoattenuation within both anterior frontal lobes and the anterior right temporal lobe. No midline shift or other mass effect. Vascular: No abnormal hyperdensity of the major intracranial arteries or dural venous sinuses. No intracranial atherosclerosis. Skull: The visualized skull base, calvarium and extracranial soft tissues are normal. Sinuses/Orbits: Opacification of the left maxillary sinus. The orbits are normal. IMPRESSION: 1. Thin subdural hematoma over the right convexity, 6 mm in thickness. No midline shift or other mass effect. 2. Hypoattenuation within both anterior frontal lobes and the anterior right temporal lobe, favored to be secondary to vasogenic edema and possibly indicating the presence of metastatic disease. MRI with and without contrast is recommended for further characterization. Critical Value/emergent results were called by telephone at the time of interpretation on 08/08/2022 at 12:45 am to provider [facility] , who verbally acknowledged these results. Electronically Signed   By: Ulyses Jarred M.D.   On: 08/08/2022 00:45   CT Angio Chest PE W and/or Wo Contrast  Result Date: 08/08/2022 CLINICAL DATA:  Weakness for several months and weight loss, initial encounter EXAM: CT ANGIOGRAPHY CHEST CT ABDOMEN AND PELVIS WITH CONTRAST TECHNIQUE: Multidetector CT imaging of the chest was performed using the standard protocol during bolus administration of intravenous contrast. Multiplanar CT image reconstructions and MIPs were obtained to evaluate the vascular anatomy. Multidetector CT imaging of the abdomen and pelvis was performed using the standard protocol during bolus administration of intravenous contrast. RADIATION DOSE REDUCTION: This exam was performed according to the departmental dose-optimization program which includes automated exposure control,  adjustment of the mA and/or kV according to patient  size and/or use of iterative reconstruction technique. CONTRAST:  53m OMNIPAQUE IOHEXOL 350 MG/ML SOLN COMPARISON:  None Available. FINDINGS: CTA CHEST FINDINGS Cardiovascular: Thoracic aorta demonstrates a normal branching pattern. No aneurysmal dilatation or dissection is noted. Heart is mildly enlarged in size. No coronary calcifications are seen. The pulmonary artery shows a normal branching pattern bilaterally without filling defect to suggest pulmonary embolism. Mediastinum/Nodes: Thoracic inlet is within normal limits. Prominent axillary nodes are noted bilaterally measuring up to 11 mm in short axis on the right and 17 mm in short axis on the left. The left pectoralis minor muscle is somewhat nodular likely representing some associated adenopathy. Some adenopathy measuring up to 1 cm noted the left retropectoral region as well. Scattered lymph nodes are noted in the mediastinum measuring up to 11 mm in short axis along the aorta. Small hilar nodes are noted as well. The esophagus appears within limits. Lungs/Pleura: Large bilateral pleural effusions are noted left slightly greater than right. A 1.6 by 0.9 cm nodule is noted in the medial aspect of the left upper lobe consistent with metastatic disease. Left lower lobe consolidation is identified. The right lung demonstrates a nodule in the upper lobe measuring approximately 9 mm in greatest dimension. A few scattered smaller nodules are noted in the right upper lobe as well as a masslike density in the right perihilar region which measures approximately 4.7 by 3.2 cm. A portion of this may be related to consolidation as well. Musculoskeletal: Bony structures of the chest demonstrate mottled sclerosis throughout the thoracic vertebral bodies as well as multiple small lytic lesions throughout the ribcage. Similar findings are noted within the sternum. Multiple healed rib fractures are noted bilaterally  likely pathologic in nature. No compression deformity is seen. Lytic lesion is noted in the posterior elements of T10 on the right. The breasts are dense bilaterally. Diffuse skin thickening is noted over the left breast incompletely evaluated. 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 Further workup is recommended. Additionally there is a soft tissue mass in the upper portion of the right breast which measures at least 3 cm in greatest dimension also suspicious for breast carcinoma. Review of the MIP images confirms the above findings. CT ABDOMEN and PELVIS FINDINGS Hepatobiliary: No focal liver abnormality is seen. No gallstones, gallbladder wall thickening, or biliary dilatation. Pancreas: Unremarkable. No pancreatic ductal dilatation or surrounding inflammatory changes. Spleen: Normal in size without focal abnormality. Adrenals/Urinary Tract: Adrenal glands are within normal limits bilaterally. Kidneys demonstrate a normal enhancement pattern. No renal calculi or obstructive changes are noted. Normal excretion is noted on delayed images. The bladder is decompressed. Stomach/Bowel: The appendix is within normal limits. No obstructive or inflammatory changes colon seen. Small bowel and stomach are unremarkable. Vascular/Lymphatic: No significant vascular findings are present. No enlarged abdominal or pelvic lymph nodes. Reproductive: Status post hysterectomy. No adnexal masses. Other: No free fluid is noted.  No herniation is seen. Musculoskeletal: Bilateral hip replacements are noted. Mottled sclerosis is noted throughout the visualized bony structures the abdomen pelvis similar to that seen chest again consistent with metastatic disease. No compression deformities are noted. Postsurgical fusion is noted in the lumbar spine posteriorly. Review of the MIP images confirms the above findings. IMPRESSION: CTA of the chest: No evidence of pulmonary emboli. Constellation of findings consistent  with breast carcinoma( possibly bilaterally) with diffuse skeletal metastatic disease, bilateral axillary and left retropectoral adenopathy, bilateral pleural effusions and as well as bilateral parenchymal lung nodules and a right perihilar  mass lesion. Further workup of the breast abnormalities is recommended. CT of the abdomen and pelvis: Diffuse skeletal metastatic disease. No other focal abnormality is noted. Electronically Signed   By: Inez Catalina M.D.   On: 08/08/2022 00:41   CT ABDOMEN PELVIS W CONTRAST  Result Date: 08/08/2022 CLINICAL DATA:  Weakness for several months and weight loss, initial encounter EXAM: CT ANGIOGRAPHY CHEST CT ABDOMEN AND PELVIS WITH CONTRAST TECHNIQUE: Multidetector CT imaging of the chest was performed using the standard protocol during bolus administration of intravenous contrast. Multiplanar CT image reconstructions and MIPs were obtained to evaluate the vascular anatomy. Multidetector CT imaging of the abdomen and pelvis was performed using the standard protocol during bolus administration of intravenous contrast. RADIATION DOSE REDUCTION: This exam was performed according to the departmental dose-optimization program which includes automated exposure control, adjustment of the mA and/or kV according to patient size and/or use of iterative reconstruction technique. CONTRAST:  23m OMNIPAQUE IOHEXOL 350 MG/ML SOLN COMPARISON:  None Available. FINDINGS: CTA CHEST FINDINGS Cardiovascular: Thoracic aorta demonstrates a normal branching pattern. No aneurysmal dilatation or dissection is noted. Heart is mildly enlarged in size. No coronary calcifications are seen. The pulmonary artery shows a normal branching pattern bilaterally without filling defect to suggest pulmonary embolism. Mediastinum/Nodes: Thoracic inlet is within normal limits. Prominent axillary nodes are noted bilaterally measuring up to 11 mm in short axis on the right and 17 mm in short axis on the left. The left  pectoralis minor muscle is somewhat nodular likely representing some associated adenopathy. Some adenopathy measuring up to 1 cm noted the left retropectoral region as well. Scattered lymph nodes are noted in the mediastinum measuring up to 11 mm in short axis along the aorta. Small hilar nodes are noted as well. The esophagus appears within limits. Lungs/Pleura: Large bilateral pleural effusions are noted left slightly greater than right. A 1.6 by 0.9 cm nodule is noted in the medial aspect of the left upper lobe consistent with metastatic disease. Left lower lobe consolidation is identified. The right lung demonstrates a nodule in the upper lobe measuring approximately 9 mm in greatest dimension. A few scattered smaller nodules are noted in the right upper lobe as well as a masslike density in the right perihilar region which measures approximately 4.7 by 3.2 cm. A portion of this may be related to consolidation as well. Musculoskeletal: Bony structures of the chest demonstrate mottled sclerosis throughout the thoracic vertebral bodies as well as multiple small lytic lesions throughout the ribcage. Similar findings are noted within the sternum. Multiple healed rib fractures are noted bilaterally likely pathologic in nature. No compression deformity is seen. Lytic lesion is noted in the posterior elements of T10 on the right. The breasts are dense bilaterally. Diffuse skin thickening is noted over the left breast incompletely evaluated. 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 Further workup is recommended. Additionally there is a soft tissue mass in the upper portion of the right breast which measures at least 3 cm in greatest dimension also suspicious for breast carcinoma. Review of the MIP images confirms the above findings. CT ABDOMEN and PELVIS FINDINGS Hepatobiliary: No focal liver abnormality is seen. No gallstones, gallbladder wall thickening, or biliary dilatation. Pancreas:  Unremarkable. No pancreatic ductal dilatation or surrounding inflammatory changes. Spleen: Normal in size without focal abnormality. Adrenals/Urinary Tract: Adrenal glands are within normal limits bilaterally. Kidneys demonstrate a normal enhancement pattern. No renal calculi or obstructive changes are noted. Normal excretion is noted on delayed images.  The bladder is decompressed. Stomach/Bowel: The appendix is within normal limits. No obstructive or inflammatory changes colon seen. Small bowel and stomach are unremarkable. Vascular/Lymphatic: No significant vascular findings are present. No enlarged abdominal or pelvic lymph nodes. Reproductive: Status post hysterectomy. No adnexal masses. Other: No free fluid is noted.  No herniation is seen. Musculoskeletal: Bilateral hip replacements are noted. Mottled sclerosis is noted throughout the visualized bony structures the abdomen pelvis similar to that seen chest again consistent with metastatic disease. No compression deformities are noted. Postsurgical fusion is noted in the lumbar spine posteriorly. Review of the MIP images confirms the above findings. IMPRESSION: CTA of the chest: No evidence of pulmonary emboli. Constellation of findings consistent with breast carcinoma( possibly bilaterally) with diffuse skeletal metastatic disease, bilateral axillary and left retropectoral adenopathy, bilateral pleural effusions and as well as bilateral parenchymal lung nodules and a right perihilar mass lesion. Further workup of the breast abnormalities is recommended. CT of the abdomen and pelvis: Diffuse skeletal metastatic disease. No other focal abnormality is noted. Electronically Signed   By: Inez Catalina M.D.   On: 08/08/2022 00:41    Microbiology: Recent Results (from the past 240 hour(s))  SARS Coronavirus 2 by RT PCR (hospital order, performed in [facility] hospital lab) *cepheid single result test* Anterior Nasal Swab     Status: None   Collection Time:  08/08/22 12:25 AM   Specimen: Anterior Nasal Swab  Result Value Ref Range Status   SARS Coronavirus 2 by RT PCR NEGATIVE NEGATIVE Final    Comment: (NOTE) SARS-CoV-2 target nucleic acids are NOT DETECTED.  The SARS-CoV-2 RNA is generally detectable in upper and lower respiratory specimens during the acute phase of infection. The lowest concentration of SARS-CoV-2 viral copies this assay can detect is 250 copies / mL. A negative result does not preclude SARS-CoV-2 infection and should not be used as the sole basis for treatment or other patient management decisions.  A negative result may occur with improper specimen collection / handling, submission of specimen other than nasopharyngeal swab, presence of viral mutation(s) within the areas targeted by this assay, and inadequate number of viral copies (<250 copies / mL). A negative result must be combined with clinical observations, patient history, and epidemiological information.  Fact Sheet for Patients:   https://www.patel.info/  Fact Sheet for Healthcare Providers: https://hall.com/  This test is not yet approved or  cleared by the [place] FDA and has been authorized for detection and/or diagnosis of SARS-CoV-2 by FDA under an Emergency Use Authorization (EUA).  This EUA will remain in effect (meaning this test can be used) for the duration of the COVID-19 declaration under Section 564(b)(1) of the Act, 21 U.S.C. section 360bbb-3(b)(1), unless the authorization is terminated or revoked sooner.  Performed at [facility], [address], [place], [place] [address]   Urine Culture     Status: Abnormal   Collection Time: 08/08/22 12:25 AM   Specimen: Urine, Clean Catch  Result Value Ref Range Status   Specimen Description   Final    URINE, CLEAN CATCH Performed at [facility], [address], [place], [place] [address]    Special Requests   Final     NONE Performed at [facility], [address] [place], [place] [address]    Culture MULTIPLE SPECIES PRESENT, SUGGEST RECOLLECTION (A)  Final   Report Status 09-04-2022 FINAL  Final    Time spent: 15 minutes  Signed: Mckinley Jewel, MD 08/15/22

## 2022-08-22 NOTE — Progress Notes (Signed)
Nutrition Brief Note  Chart reviewed. Pt now transitioning to comfort care.  No further nutrition interventions planned at this time.  Please re-consult as needed.   Idonia Zollinger W, RD, LDN, CDCES Registered Dietitian II Certified Diabetes Care and Education Specialist Please refer to AMION for RD and/or RD on-call/weekend/after hours pager   

## 2022-08-22 DEATH — deceased

## 2022-10-14 ENCOUNTER — Other Ambulatory Visit: Payer: Self-pay | Admitting: Internal Medicine

## 2022-10-14 ENCOUNTER — Other Ambulatory Visit: Payer: Self-pay | Admitting: Family Medicine

## 2022-10-14 DIAGNOSIS — J4530 Mild persistent asthma, uncomplicated: Secondary | ICD-10-CM

## 2022-10-14 DIAGNOSIS — J309 Allergic rhinitis, unspecified: Secondary | ICD-10-CM

## 2022-10-14 DIAGNOSIS — I10 Essential (primary) hypertension: Secondary | ICD-10-CM
